# Patient Record
Sex: Female | Born: 1950 | Race: White | Hispanic: No | Smoking: Never smoker
Health system: Southern US, Community
[De-identification: ages and names within clinical notes are randomized; demographics above are authoritative.]

## PROBLEM LIST (undated history)

## (undated) DIAGNOSIS — I10 Essential (primary) hypertension: Secondary | ICD-10-CM

## (undated) DIAGNOSIS — K859 Acute pancreatitis without necrosis or infection, unspecified: Secondary | ICD-10-CM

## (undated) DIAGNOSIS — F329 Major depressive disorder, single episode, unspecified: Secondary | ICD-10-CM

## (undated) DIAGNOSIS — M1990 Unspecified osteoarthritis, unspecified site: Secondary | ICD-10-CM

## (undated) DIAGNOSIS — F32A Depression, unspecified: Secondary | ICD-10-CM

## (undated) DIAGNOSIS — E039 Hypothyroidism, unspecified: Secondary | ICD-10-CM

## (undated) DIAGNOSIS — F419 Anxiety disorder, unspecified: Secondary | ICD-10-CM

## (undated) DIAGNOSIS — Z87440 Personal history of urinary (tract) infections: Secondary | ICD-10-CM

## (undated) DIAGNOSIS — R51 Headache: Secondary | ICD-10-CM

## (undated) DIAGNOSIS — K219 Gastro-esophageal reflux disease without esophagitis: Secondary | ICD-10-CM

## (undated) HISTORY — PX: TUBAL LIGATION: SHX77

## (undated) HISTORY — PX: DILATION AND CURETTAGE OF UTERUS: SHX78

## (undated) HISTORY — DX: Acute pancreatitis without necrosis or infection, unspecified: K85.90

## (undated) HISTORY — PX: VAGINAL HYSTERECTOMY: SUR661

## (undated) HISTORY — PX: EYE SURGERY: SHX253

## (undated) HISTORY — PX: TONSILLECTOMY: SUR1361

## (undated) HISTORY — PX: BREAST SURGERY: SHX581

## (undated) HISTORY — PX: OTHER SURGICAL HISTORY: SHX169

---

## 2009-04-18 HISTORY — PX: COLONOSCOPY: SHX174

## 2010-11-02 ENCOUNTER — Inpatient Hospital Stay: Payer: Self-pay | Admitting: Psychiatry

## 2013-02-05 ENCOUNTER — Other Ambulatory Visit: Payer: Self-pay | Admitting: Orthopedic Surgery

## 2013-02-08 ENCOUNTER — Encounter (HOSPITAL_COMMUNITY): Payer: Self-pay | Admitting: Pharmacy Technician

## 2013-02-12 ENCOUNTER — Encounter (HOSPITAL_COMMUNITY): Payer: Self-pay

## 2013-02-12 ENCOUNTER — Ambulatory Visit (HOSPITAL_COMMUNITY)
Admission: RE | Admit: 2013-02-12 | Discharge: 2013-02-12 | Disposition: A | Discharge status: Home / Self Care | Payer: PRIVATE HEALTH INSURANCE | Source: Ambulatory Visit | Attending: Orthopedic Surgery | Admitting: Orthopedic Surgery

## 2013-02-12 ENCOUNTER — Encounter (HOSPITAL_COMMUNITY)
Admission: RE | Admit: 2013-02-12 | Discharge: 2013-02-12 | Disposition: A | Discharge status: Home / Self Care | Payer: PRIVATE HEALTH INSURANCE | Source: Ambulatory Visit | Attending: Orthopedic Surgery | Admitting: Orthopedic Surgery

## 2013-02-12 ENCOUNTER — Other Ambulatory Visit (HOSPITAL_COMMUNITY): Payer: Self-pay | Admitting: *Deleted

## 2013-02-12 DIAGNOSIS — Z01812 Encounter for preprocedural laboratory examination: Secondary | ICD-10-CM | POA: Insufficient documentation

## 2013-02-12 DIAGNOSIS — Z01818 Encounter for other preprocedural examination: Secondary | ICD-10-CM | POA: Insufficient documentation

## 2013-02-12 DIAGNOSIS — M4120 Other idiopathic scoliosis, site unspecified: Secondary | ICD-10-CM | POA: Insufficient documentation

## 2013-02-12 HISTORY — DX: Essential (primary) hypertension: I10

## 2013-02-12 HISTORY — DX: Major depressive disorder, single episode, unspecified: F32.9

## 2013-02-12 HISTORY — DX: Anxiety disorder, unspecified: F41.9

## 2013-02-12 HISTORY — DX: Gastro-esophageal reflux disease without esophagitis: K21.9

## 2013-02-12 HISTORY — DX: Headache: R51

## 2013-02-12 HISTORY — DX: Unspecified osteoarthritis, unspecified site: M19.90

## 2013-02-12 HISTORY — DX: Hypothyroidism, unspecified: E03.9

## 2013-02-12 HISTORY — DX: Depression, unspecified: F32.A

## 2013-02-12 HISTORY — DX: Personal history of urinary (tract) infections: Z87.440

## 2013-02-12 LAB — CBC WITH DIFFERENTIAL/PLATELET
Eosinophils Absolute: 0.4 10*3/uL (ref 0.0–0.7)
Eosinophils Relative: 5 % (ref 0–5)
Hemoglobin: 13.5 g/dL (ref 12.0–15.0)
Lymphocytes Relative: 41 % (ref 12–46)
Lymphs Abs: 3.2 10*3/uL (ref 0.7–4.0)
MCH: 31.5 pg (ref 26.0–34.0)
MCV: 91.8 fL (ref 78.0–100.0)
Monocytes Relative: 11 % (ref 3–12)
Neutro Abs: 3.3 10*3/uL (ref 1.7–7.7)
Neutrophils Relative %: 43 % (ref 43–77)
RBC: 4.29 MIL/uL (ref 3.87–5.11)

## 2013-02-12 LAB — COMPREHENSIVE METABOLIC PANEL
Alkaline Phosphatase: 67 U/L (ref 39–117)
BUN: 13 mg/dL (ref 6–23)
CO2: 28 mEq/L (ref 19–32)
Chloride: 99 mEq/L (ref 96–112)
Creatinine, Ser: 0.74 mg/dL (ref 0.50–1.10)
GFR calc Af Amer: >90 mL/min (ref >90)
GFR calc non Af Amer: 89 mL/min — ABNORMAL LOW (ref >90)
Glucose, Bld: 87 mg/dL (ref 70–99)
Potassium: 3.8 mEq/L (ref 3.5–5.1)
Total Bilirubin: 0.5 mg/dL (ref 0.3–1.2)
Total Protein: 8.1 g/dL (ref 6.0–8.3)

## 2013-02-12 LAB — TYPE AND SCREEN

## 2013-02-12 LAB — URINE MICROSCOPIC-ADD ON

## 2013-02-12 LAB — URINALYSIS, ROUTINE W REFLEX MICROSCOPIC
Bilirubin Urine: NEGATIVE
Glucose, UA: NEGATIVE mg/dL
Hgb urine dipstick: NEGATIVE
Protein, ur: NEGATIVE mg/dL
Urobilinogen, UA: 0.2 mg/dL (ref 0.0–1.0)

## 2013-02-12 LAB — ABO/RH: ABO/RH(D): A POS

## 2013-02-12 LAB — PROTIME-INR: Prothrombin Time: 12.8 seconds (ref 11.6–15.2)

## 2013-02-12 LAB — SURGICAL PCR SCREEN
MRSA, PCR: NEGATIVE
Staphylococcus aureus: NEGATIVE

## 2013-02-12 LAB — APTT: aPTT: 34 seconds (ref 24–37)

## 2013-02-12 NOTE — Pre-Procedure Instructions (Signed)
Ann Freeman  02/12/2013   Your procedure is scheduled on:  Monday, February 18, 2013 at 8:45 AM.   Report to [facility] Entrance "A" at 6:45 AM.   Call this number if you have problems the morning of surgery: [phone]   Remember:   Do not eat food or drink liquids after midnight Sunday, 02/17/13.   Take these medicines the morning of surgery with A SIP OF WATER: levothyroxine (SYNTHROID, LEVOTHROID), sertraline (ZOLOFT) triamcinolne (NASACORT AQ)  Stop all Vitamins, Herbal Medications, Fish Oil, Aspirin and NSAIDS (Naproxen) as of today, 02/12/13.        Do not wear jewelry, make-up or nail polish.  Do not wear lotions, powders, or perfumes. You may wear deodorant.  Do not shave 48 hours prior to surgery.  Do not bring valuables to the hospital.  [facility] is not responsible                  for any belongings or valuables.               Contacts, dentures or bridgework may not be worn into surgery.  Leave suitcase in the car. After surgery it may be brought to your room.  For patients admitted to the hospital, discharge time is determined by your                treatment team.                Special Instructions: Shower using CHG 2 nights before surgery and the night before surgery.  If you shower the day of surgery use CHG.  Use special wash - you have one bottle of CHG for all showers.  You should use approximately 1/3 of the bottle for each shower.   Please read over the following fact sheets that you were given: Pain Booklet, Coughing and Deep Breathing, Blood Transfusion Information, MRSA Information and Surgical Site Infection Prevention

## 2013-02-13 NOTE — Progress Notes (Signed)
[facility] Internal [facility] in [place] [phone]) and re-requested EKG.  Office to fax to [phone]  (

## 2013-02-17 MED ORDER — TRANEXAMIC ACID 100 MG/ML IV SOLN
1000.0000 mg | INTRAVENOUS | Status: AC
  Administered 2013-02-18: 1000 mg via INTRAVENOUS
  Filled 2013-02-17: qty 10

## 2013-02-17 MED ORDER — CEFAZOLIN SODIUM-DEXTROSE 2-3 GM-% IV SOLR
2.0000 g | INTRAVENOUS | Status: AC
  Administered 2013-02-18: 2 g via INTRAVENOUS
  Filled 2013-02-17: qty 50

## 2013-02-18 ENCOUNTER — Encounter (HOSPITAL_COMMUNITY): Payer: Self-pay | Admitting: Anesthesiology

## 2013-02-18 ENCOUNTER — Inpatient Hospital Stay (HOSPITAL_COMMUNITY)
Admission: RE | Admit: 2013-02-18 | Discharge: 2013-02-20 | DRG: 470 | Disposition: A | Discharge status: Home Health | Payer: PRIVATE HEALTH INSURANCE | Source: Ambulatory Visit | Attending: Orthopedic Surgery | Admitting: Orthopedic Surgery

## 2013-02-18 ENCOUNTER — Inpatient Hospital Stay (HOSPITAL_COMMUNITY): Payer: PRIVATE HEALTH INSURANCE | Admitting: Anesthesiology

## 2013-02-18 ENCOUNTER — Encounter (HOSPITAL_COMMUNITY): Payer: PRIVATE HEALTH INSURANCE | Admitting: Anesthesiology

## 2013-02-18 ENCOUNTER — Encounter (HOSPITAL_COMMUNITY)
Admission: RE | Disposition: A | Discharge status: Home Health | Payer: Self-pay | Source: Ambulatory Visit | Attending: Orthopedic Surgery

## 2013-02-18 DIAGNOSIS — F329 Major depressive disorder, single episode, unspecified: Secondary | ICD-10-CM | POA: Diagnosis present

## 2013-02-18 DIAGNOSIS — K219 Gastro-esophageal reflux disease without esophagitis: Secondary | ICD-10-CM | POA: Diagnosis present

## 2013-02-18 DIAGNOSIS — Z9849 Cataract extraction status, unspecified eye: Secondary | ICD-10-CM

## 2013-02-18 DIAGNOSIS — F411 Generalized anxiety disorder: Secondary | ICD-10-CM | POA: Diagnosis present

## 2013-02-18 DIAGNOSIS — E039 Hypothyroidism, unspecified: Secondary | ICD-10-CM | POA: Diagnosis present

## 2013-02-18 DIAGNOSIS — M1710 Unilateral primary osteoarthritis, unspecified knee: Principal | ICD-10-CM | POA: Diagnosis present

## 2013-02-18 DIAGNOSIS — Z96651 Presence of right artificial knee joint: Secondary | ICD-10-CM

## 2013-02-18 DIAGNOSIS — Z882 Allergy status to sulfonamides status: Secondary | ICD-10-CM

## 2013-02-18 DIAGNOSIS — D62 Acute posthemorrhagic anemia: Secondary | ICD-10-CM | POA: Diagnosis not present

## 2013-02-18 DIAGNOSIS — Z9851 Tubal ligation status: Secondary | ICD-10-CM

## 2013-02-18 DIAGNOSIS — Z8249 Family history of ischemic heart disease and other diseases of the circulatory system: Secondary | ICD-10-CM

## 2013-02-18 DIAGNOSIS — I10 Essential (primary) hypertension: Secondary | ICD-10-CM | POA: Diagnosis present

## 2013-02-18 DIAGNOSIS — Z23 Encounter for immunization: Secondary | ICD-10-CM

## 2013-02-18 DIAGNOSIS — F3289 Other specified depressive episodes: Secondary | ICD-10-CM | POA: Diagnosis present

## 2013-02-18 DIAGNOSIS — Z7901 Long term (current) use of anticoagulants: Secondary | ICD-10-CM

## 2013-02-18 DIAGNOSIS — Z961 Presence of intraocular lens: Secondary | ICD-10-CM

## 2013-02-18 DIAGNOSIS — Z79899 Other long term (current) drug therapy: Secondary | ICD-10-CM

## 2013-02-18 HISTORY — PX: TOTAL KNEE ARTHROPLASTY: SHX125

## 2013-02-18 LAB — CBC
HCT: 36.6 % (ref 36.0–46.0)
MCHC: 34.7 g/dL (ref 30.0–36.0)
MCV: 92.4 fL (ref 78.0–100.0)
Platelets: 208 10*3/uL (ref 150–400)
RDW: 13.5 % (ref 11.5–15.5)
WBC: 10.6 10*3/uL — ABNORMAL HIGH (ref 4.0–10.5)

## 2013-02-18 LAB — CREATININE, SERUM: GFR calc Af Amer: >90 mL/min (ref >90)

## 2013-02-18 SURGERY — ARTHROPLASTY, KNEE, TOTAL
Anesthesia: General | Site: Knee | Laterality: Right | Wound class: Clean

## 2013-02-18 MED ORDER — SERTRALINE HCL 100 MG PO TABS
100.0000 mg | ORAL_TABLET | Freq: Every day | ORAL | Status: DC
Start: 2013-02-19 — End: 2013-02-20
  Administered 2013-02-19 – 2013-02-20 (×2): 100 mg via ORAL
  Filled 2013-02-18 (×2): qty 1

## 2013-02-18 MED ORDER — BISOPROLOL-HYDROCHLOROTHIAZIDE 5-6.25 MG PO TABS
1.0000 | ORAL_TABLET | Freq: Every day | ORAL | Status: DC
  Administered 2013-02-19 – 2013-02-20 (×2): 1 via ORAL
  Filled 2013-02-18 (×2): qty 1

## 2013-02-18 MED ORDER — METOCLOPRAMIDE HCL 10 MG PO TABS: 5.0000 mg | ORAL_TABLET | Freq: Three times a day (TID) | ORAL | Status: DC | PRN

## 2013-02-18 MED ORDER — CHLORHEXIDINE GLUCONATE 4 % EX LIQD: 60.0000 mL | Freq: Once | CUTANEOUS | Status: DC

## 2013-02-18 MED ORDER — SENNOSIDES-DOCUSATE SODIUM 8.6-50 MG PO TABS: 1.0000 | ORAL_TABLET | Freq: Every evening | ORAL | Status: DC | PRN

## 2013-02-18 MED ORDER — NAPROXEN 500 MG PO TABS
500.0000 mg | ORAL_TABLET | Freq: Two times a day (BID) | ORAL | Status: DC | PRN
  Filled 2013-02-18: qty 1

## 2013-02-18 MED ORDER — ALUM & MAG HYDROXIDE-SIMETH 200-200-20 MG/5ML PO SUSP: 30.0000 mL | ORAL | Status: DC | PRN

## 2013-02-18 MED ORDER — LACTATED RINGERS IV SOLN
INTRAVENOUS | Status: DC
  Administered 2013-02-18 (×2): via INTRAVENOUS

## 2013-02-18 MED ORDER — METHOCARBAMOL 100 MG/ML IJ SOLN
500.0000 mg | Freq: Four times a day (QID) | INTRAVENOUS | Status: DC | PRN
  Filled 2013-02-18: qty 5

## 2013-02-18 MED ORDER — FENTANYL CITRATE 0.05 MG/ML IJ SOLN
INTRAMUSCULAR | Status: AC
  Administered 2013-02-18: 100 ug via INTRAVENOUS
  Filled 2013-02-18: qty 2

## 2013-02-18 MED ORDER — ACETAMINOPHEN 650 MG RE SUPP: 650.0000 mg | Freq: Four times a day (QID) | RECTAL | Status: DC | PRN

## 2013-02-18 MED ORDER — DOCUSATE SODIUM 100 MG PO CAPS
100.0000 mg | ORAL_CAPSULE | Freq: Two times a day (BID) | ORAL | Status: DC
  Administered 2013-02-18 – 2013-02-20 (×5): 100 mg via ORAL
  Filled 2013-02-18 (×6): qty 1

## 2013-02-18 MED ORDER — SODIUM CHLORIDE 0.9 % IV SOLN: INTRAVENOUS | Status: DC

## 2013-02-18 MED ORDER — FENTANYL CITRATE 0.05 MG/ML IJ SOLN
100.0000 ug | Freq: Once | INTRAMUSCULAR | Status: AC
  Administered 2013-02-18: 100 ug via INTRAVENOUS

## 2013-02-18 MED ORDER — DEXTROSE 5 % IV SOLN
INTRAVENOUS | Status: DC | PRN
  Administered 2013-02-18 (×2): via INTRAVENOUS

## 2013-02-18 MED ORDER — PHENOL 1.4 % MT LIQD: 1.0000 | OROMUCOSAL | Status: DC | PRN

## 2013-02-18 MED ORDER — METOCLOPRAMIDE HCL 5 MG/ML IJ SOLN: 5.0000 mg | Freq: Three times a day (TID) | INTRAMUSCULAR | Status: DC | PRN

## 2013-02-18 MED ORDER — HYDROMORPHONE HCL PF 1 MG/ML IJ SOLN
INTRAMUSCULAR | Status: AC
  Filled 2013-02-18: qty 1

## 2013-02-18 MED ORDER — OXYCODONE HCL 5 MG PO TABS
ORAL_TABLET | ORAL | Status: AC
  Filled 2013-02-18: qty 1

## 2013-02-18 MED ORDER — DIPHENHYDRAMINE HCL 12.5 MG/5ML PO ELIX: 12.5000 mg | ORAL_SOLUTION | ORAL | Status: DC | PRN

## 2013-02-18 MED ORDER — ONDANSETRON HCL 4 MG PO TABS: 4.0000 mg | ORAL_TABLET | Freq: Four times a day (QID) | ORAL | Status: DC | PRN

## 2013-02-18 MED ORDER — OXYCODONE HCL ER 10 MG PO T12A
10.0000 mg | EXTENDED_RELEASE_TABLET | Freq: Two times a day (BID) | ORAL | Status: DC
  Administered 2013-02-18 – 2013-02-20 (×5): 10 mg via ORAL
  Filled 2013-02-18 (×6): qty 1

## 2013-02-18 MED ORDER — SIMVASTATIN 40 MG PO TABS
40.0000 mg | ORAL_TABLET | Freq: Every evening | ORAL | Status: DC
  Administered 2013-02-18 – 2013-02-19 (×2): 40 mg via ORAL
  Filled 2013-02-18 (×3): qty 1

## 2013-02-18 MED ORDER — BISACODYL 5 MG PO TBEC: 5.0000 mg | DELAYED_RELEASE_TABLET | Freq: Every day | ORAL | Status: DC | PRN

## 2013-02-18 MED ORDER — PHENYLEPHRINE HCL 10 MG/ML IJ SOLN
INTRAMUSCULAR | Status: DC | PRN
  Administered 2013-02-18: 40 ug via INTRAVENOUS
  Administered 2013-02-18: 80 ug via INTRAVENOUS
  Administered 2013-02-18: 120 ug via INTRAVENOUS

## 2013-02-18 MED ORDER — SODIUM CHLORIDE 0.9 % IR SOLN
Status: DC | PRN
  Administered 2013-02-18: 3000 mL

## 2013-02-18 MED ORDER — TRIAMCINOLONE ACETONIDE 55 MCG/ACT NA AERO: 2.0000 | INHALATION_SPRAY | Freq: Every day | NASAL | Status: DC | PRN

## 2013-02-18 MED ORDER — SODIUM CHLORIDE 0.9 % IV SOLN
INTRAVENOUS | Status: DC
  Administered 2013-02-18 – 2013-02-19 (×2): via INTRAVENOUS

## 2013-02-18 MED ORDER — OXYCODONE HCL 5 MG/5ML PO SOLN: 5.0000 mg | Freq: Once | ORAL | Status: AC | PRN

## 2013-02-18 MED ORDER — MIDAZOLAM HCL 2 MG/2ML IJ SOLN
INTRAMUSCULAR | Status: AC
  Administered 2013-02-18: 2 mg
  Filled 2013-02-18: qty 2

## 2013-02-18 MED ORDER — ACETAMINOPHEN 325 MG PO TABS: 650.0000 mg | ORAL_TABLET | Freq: Four times a day (QID) | ORAL | Status: DC | PRN

## 2013-02-18 MED ORDER — BUPIVACAINE LIPOSOME 1.3 % IJ SUSP
20.0000 mL | Freq: Once | INTRAMUSCULAR | Status: DC
  Filled 2013-02-18: qty 20

## 2013-02-18 MED ORDER — CEFAZOLIN SODIUM 1-5 GM-% IV SOLN
1.0000 g | Freq: Four times a day (QID) | INTRAVENOUS | Status: AC
  Administered 2013-02-18 (×2): 1 g via INTRAVENOUS
  Filled 2013-02-18 (×3): qty 50

## 2013-02-18 MED ORDER — METHOCARBAMOL 500 MG PO TABS
ORAL_TABLET | ORAL | Status: AC
  Administered 2013-02-18: 500 mg
  Filled 2013-02-18: qty 1

## 2013-02-18 MED ORDER — BUPIVACAINE HCL (PF) 0.5 % IJ SOLN
INTRAMUSCULAR | Status: DC | PRN
  Administered 2013-02-18: 30 mL

## 2013-02-18 MED ORDER — LEVOTHYROXINE SODIUM 112 MCG PO TABS
112.0000 ug | ORAL_TABLET | Freq: Every day | ORAL | Status: DC
  Administered 2013-02-19 – 2013-02-20 (×2): 112 ug via ORAL
  Filled 2013-02-18 (×3): qty 1

## 2013-02-18 MED ORDER — OXYCODONE HCL 5 MG PO TABS
5.0000 mg | ORAL_TABLET | ORAL | Status: DC | PRN
  Administered 2013-02-18 – 2013-02-20 (×11): 10 mg via ORAL
  Filled 2013-02-18 (×11): qty 2

## 2013-02-18 MED ORDER — METHOCARBAMOL 500 MG PO TABS
500.0000 mg | ORAL_TABLET | Freq: Four times a day (QID) | ORAL | Status: DC | PRN
  Administered 2013-02-18 – 2013-02-20 (×5): 500 mg via ORAL
  Filled 2013-02-18 (×5): qty 1

## 2013-02-18 MED ORDER — INFLUENZA VAC SPLIT QUAD 0.5 ML IM SUSP
0.5000 mL | INTRAMUSCULAR | Status: AC
  Administered 2013-02-19: 0.5 mL via INTRAMUSCULAR
  Filled 2013-02-18: qty 0.5

## 2013-02-18 MED ORDER — ONDANSETRON HCL 4 MG/2ML IJ SOLN
INTRAMUSCULAR | Status: DC | PRN
  Administered 2013-02-18: 4 mg via INTRAVENOUS

## 2013-02-18 MED ORDER — 0.9 % SODIUM CHLORIDE (POUR BTL) OPTIME
TOPICAL | Status: DC | PRN
  Administered 2013-02-18: 1000 mL

## 2013-02-18 MED ORDER — BUPIVACAINE-EPINEPHRINE PF 0.5-1:200000 % IJ SOLN
INTRAMUSCULAR | Status: DC | PRN
  Administered 2013-02-18: 25 mL

## 2013-02-18 MED ORDER — ONDANSETRON HCL 4 MG/2ML IJ SOLN: 4.0000 mg | Freq: Four times a day (QID) | INTRAMUSCULAR | Status: DC | PRN

## 2013-02-18 MED ORDER — MENTHOL 3 MG MT LOZG: 1.0000 | LOZENGE | OROMUCOSAL | Status: DC | PRN

## 2013-02-18 MED ORDER — SODIUM CHLORIDE 0.9 % IJ SOLN
INTRAMUSCULAR | Status: DC | PRN
  Administered 2013-02-18: 10:00:00

## 2013-02-18 MED ORDER — LIDOCAINE HCL (CARDIAC) 20 MG/ML IV SOLN
INTRAVENOUS | Status: DC | PRN
  Administered 2013-02-18: 80 mg via INTRAVENOUS

## 2013-02-18 MED ORDER — OXYCODONE HCL 5 MG PO TABS
5.0000 mg | ORAL_TABLET | Freq: Once | ORAL | Status: AC | PRN
  Administered 2013-02-18: 5 mg via ORAL

## 2013-02-18 MED ORDER — HYDROMORPHONE HCL PF 1 MG/ML IJ SOLN
1.0000 mg | INTRAMUSCULAR | Status: DC | PRN
  Administered 2013-02-19 (×2): 1 mg via INTRAVENOUS
  Filled 2013-02-18 (×2): qty 1

## 2013-02-18 MED ORDER — FENTANYL CITRATE 0.05 MG/ML IJ SOLN
INTRAMUSCULAR | Status: DC | PRN
  Administered 2013-02-18 (×2): 50 ug via INTRAVENOUS
  Administered 2013-02-18: 100 ug via INTRAVENOUS
  Administered 2013-02-18: 50 ug via INTRAVENOUS

## 2013-02-18 MED ORDER — PROPOFOL 10 MG/ML IV BOLUS
INTRAVENOUS | Status: DC | PRN
  Administered 2013-02-18: 200 mg via INTRAVENOUS
  Administered 2013-02-18: 50 mg via INTRAVENOUS

## 2013-02-18 MED ORDER — HYDROMORPHONE HCL PF 1 MG/ML IJ SOLN
0.2500 mg | INTRAMUSCULAR | Status: DC | PRN
  Administered 2013-02-18: 0.5 mg via INTRAVENOUS
  Administered 2013-02-18: 11:00:00 via INTRAVENOUS

## 2013-02-18 MED ORDER — HYDROMORPHONE HCL PF 1 MG/ML IJ SOLN
INTRAMUSCULAR | Status: DC | PRN
  Administered 2013-02-18 (×2): 0.5 mg via INTRAVENOUS

## 2013-02-18 MED ORDER — DEXAMETHASONE SODIUM PHOSPHATE 10 MG/ML IJ SOLN
INTRAMUSCULAR | Status: DC | PRN
  Administered 2013-02-18: 8 mg via INTRAVENOUS

## 2013-02-18 MED ORDER — ZOLPIDEM TARTRATE 5 MG PO TABS: 5.0000 mg | ORAL_TABLET | Freq: Every evening | ORAL | Status: DC | PRN

## 2013-02-18 MED ORDER — ENOXAPARIN SODIUM 30 MG/0.3ML ~~LOC~~ SOLN
30.0000 mg | Freq: Two times a day (BID) | SUBCUTANEOUS | Status: DC
  Administered 2013-02-19 – 2013-02-20 (×3): 30 mg via SUBCUTANEOUS
  Filled 2013-02-18 (×5): qty 0.3

## 2013-02-18 MED ORDER — CEFAZOLIN SODIUM-DEXTROSE 2-3 GM-% IV SOLR
INTRAVENOUS | Status: AC
  Filled 2013-02-18: qty 50

## 2013-02-18 MED ORDER — ONDANSETRON HCL 4 MG/2ML IJ SOLN: 4.0000 mg | Freq: Once | INTRAMUSCULAR | Status: DC | PRN

## 2013-02-18 MED ORDER — FLEET ENEMA 7-19 GM/118ML RE ENEM: 1.0000 | ENEMA | Freq: Once | RECTAL | Status: AC | PRN

## 2013-02-18 MED ORDER — SODIUM CHLORIDE 0.9 % IJ SOLN
INTRAMUSCULAR | Status: AC
  Filled 2013-02-18: qty 3

## 2013-02-18 MED ORDER — MIDAZOLAM HCL 5 MG/ML IJ SOLN: 2.0000 mg | Freq: Once | INTRAMUSCULAR | Status: DC

## 2013-02-18 SURGICAL SUPPLY — 60 items
BANDAGE ESMARK 6X9 LF (GAUZE/BANDAGES/DRESSINGS) ×1 IMPLANT
BLADE SAGITTAL 13X1.27X60 (BLADE) ×2 IMPLANT
BLADE SAW SGTL 83.5X18.5 (BLADE) ×2 IMPLANT
BLADE SURG 10 STRL SS (BLADE) ×2 IMPLANT
BNDG ESMARK 6X9 LF (GAUZE/BANDAGES/DRESSINGS) ×2
BOWL SMART MIX CTS (DISPOSABLE) ×2 IMPLANT
CAP POR TM CP VIT E LN CER HD ×2 IMPLANT
CEMENT BONE SIMPLEX SPEEDSET (Cement) ×4 IMPLANT
CLOTH BEACON ORANGE TIMEOUT ST (SAFETY) ×2 IMPLANT
COVER SURGICAL LIGHT HANDLE (MISCELLANEOUS) ×2 IMPLANT
CUFF TOURNIQUET SINGLE 34IN LL (TOURNIQUET CUFF) ×2 IMPLANT
DRAPE EXTREMITY T 121X128X90 (DRAPE) ×2 IMPLANT
DRAPE INCISE IOBAN 66X45 STRL (DRAPES) ×4 IMPLANT
DRAPE PROXIMA HALF (DRAPES) ×2 IMPLANT
DRAPE U-SHAPE 47X51 STRL (DRAPES) ×2 IMPLANT
DRSG ADAPTIC 3X8 NADH LF (GAUZE/BANDAGES/DRESSINGS) ×2 IMPLANT
DRSG PAD ABDOMINAL 8X10 ST (GAUZE/BANDAGES/DRESSINGS) ×2 IMPLANT
DURAPREP 26ML APPLICATOR (WOUND CARE) ×4 IMPLANT
ELECT REM PT RETURN 9FT ADLT (ELECTROSURGICAL) ×2
ELECTRODE REM PT RTRN 9FT ADLT (ELECTROSURGICAL) ×1 IMPLANT
EVACUATOR 1/8 PVC DRAIN (DRAIN) ×2 IMPLANT
GLOVE BIO SURGEON STRL SZ8.5 (GLOVE) ×2 IMPLANT
GLOVE BIOGEL M 7.0 STRL (GLOVE) ×2 IMPLANT
GLOVE BIOGEL PI IND STRL 7.5 (GLOVE) ×1 IMPLANT
GLOVE BIOGEL PI IND STRL 8.5 (GLOVE) ×1 IMPLANT
GLOVE BIOGEL PI INDICATOR 7.5 (GLOVE) ×1
GLOVE BIOGEL PI INDICATOR 8.5 (GLOVE) ×1
GLOVE SS BIOGEL STRL SZ 7 (GLOVE) ×1 IMPLANT
GLOVE SUPERSENSE BIOGEL SZ 7 (GLOVE) ×1
GLOVE SURG ORTHO 8.0 STRL STRW (GLOVE) ×4 IMPLANT
GOWN PREVENTION PLUS XLARGE (GOWN DISPOSABLE) ×4 IMPLANT
GOWN STRL NON-REIN LRG LVL3 (GOWN DISPOSABLE) IMPLANT
GOWN STRL REIN XL XLG (GOWN DISPOSABLE) ×2 IMPLANT
HANDPIECE INTERPULSE COAX TIP (DISPOSABLE) ×1
HOOD PEEL AWAY FACE SHEILD DIS (HOOD) ×6 IMPLANT
KIT BASIN OR (CUSTOM PROCEDURE TRAY) ×2 IMPLANT
KIT ROOM TURNOVER OR (KITS) ×2 IMPLANT
MANIFOLD NEPTUNE II (INSTRUMENTS) ×2 IMPLANT
NEEDLE 22X1 1/2 (OR ONLY) (NEEDLE) ×2 IMPLANT
NEEDLE HYPO 21X1.5 SAFETY (NEEDLE) ×2 IMPLANT
NS IRRIG 1000ML POUR BTL (IV SOLUTION) ×2 IMPLANT
PACK TOTAL JOINT (CUSTOM PROCEDURE TRAY) ×2 IMPLANT
PAD ARMBOARD 7.5X6 YLW CONV (MISCELLANEOUS) ×2 IMPLANT
PADDING CAST COTTON 6X4 STRL (CAST SUPPLIES) ×2 IMPLANT
SET HNDPC FAN SPRY TIP SCT (DISPOSABLE) ×1 IMPLANT
SPONGE GAUZE 4X4 12PLY (GAUZE/BANDAGES/DRESSINGS) ×2 IMPLANT
STAPLER VISISTAT 35W (STAPLE) ×2 IMPLANT
SUCTION FRAZIER TIP 10 FR DISP (SUCTIONS) ×2 IMPLANT
SUT BONE WAX W31G (SUTURE) ×2 IMPLANT
SUT VIC AB 0 CTB1 27 (SUTURE) ×4 IMPLANT
SUT VIC AB 1 CT1 27 (SUTURE) ×2
SUT VIC AB 1 CT1 27XBRD ANBCTR (SUTURE) ×2 IMPLANT
SUT VIC AB 2-0 CT1 27 (SUTURE) ×2
SUT VIC AB 2-0 CT1 TAPERPNT 27 (SUTURE) ×2 IMPLANT
SYR 50ML LL SCALE MARK (SYRINGE) ×2 IMPLANT
SYR CONTROL 10ML LL (SYRINGE) ×2 IMPLANT
TOWEL OR 17X24 6PK STRL BLUE (TOWEL DISPOSABLE) ×2 IMPLANT
TOWEL OR 17X26 10 PK STRL BLUE (TOWEL DISPOSABLE) ×2 IMPLANT
TRAY FOLEY CATH 16FRSI W/METER (SET/KITS/TRAYS/PACK) ×2 IMPLANT
WATER STERILE IRR 1000ML POUR (IV SOLUTION) ×2 IMPLANT

## 2013-02-18 NOTE — Progress Notes (Signed)
UR COMPLETED  

## 2013-02-18 NOTE — Progress Notes (Signed)
Orthopedic Tech Progress Note Patient Details:  Ann Freeman Aug 29, 1950 [id number] CPM applied to Right LE with appropriate settings. OHF applied to bed. Patient provided with Footsie roll.  CPM Right Knee CPM Right Knee: On Right Knee Flexion (Degrees): 60 Right Knee Extension (Degrees): 0   Asia R Thompson 02/18/2013, 11:39 AM

## 2013-02-18 NOTE — Transfer of Care (Signed)
Immediate Anesthesia Transfer of Care Note  Patient: Ann Freeman  Procedure(s) Performed: Procedure(s): TOTAL KNEE ARTHROPLASTY- RIGHT (Right)  Patient Location: PACU  Anesthesia Type:General  Level of Consciousness: awake, alert , oriented, sedated and patient cooperative  Airway & Oxygen Therapy: Patient Spontanous Breathing and Patient connected to nasal cannula oxygen  Post-op Assessment: Report given to PACU RN and Post -op Vital signs reviewed and stable  Post vital signs: Reviewed and stable  Complications: No apparent anesthesia complications

## 2013-02-18 NOTE — Preoperative (Signed)
Beta Blockers   Reason not to administer Beta Blockers:Not Applicable 

## 2013-02-18 NOTE — Op Note (Signed)
TOTAL KNEE REPLACEMENT OPERATIVE NOTE:  02/18/2013  1:27 PM  PATIENT:  Ann Freeman  62 y.o. female  PRE-OPERATIVE DIAGNOSIS:  osteoarthritis right knee  POST-OPERATIVE DIAGNOSIS:  osteoarthritis right knee  PROCEDURE:  Procedure(s): TOTAL KNEE ARTHROPLASTY- RIGHT  SURGEON:  Surgeon(s): Dannielle Huh, MD  PHYSICIAN ASSISTANT: Altamese Cabal, [facility]  ANESTHESIA:   general  DRAINS: Hemovac  SPECIMEN: None  COUNTS:  Correct  TOURNIQUET:   Total Tourniquet Time Documented: Thigh (Left) - 49 minutes Total: Thigh (Left) - 49 minutes   DICTATION:  Indication for procedure:    The patient is a 62 y.o. female who has failed conservative treatment for osteoarthritis right knee.  Informed consent was obtained prior to anesthesia. The risks versus benefits of the operation were explain and in a way the patient can, and did, understand.   On the implant demand matching protocol, this patient scored 10.  Therefore, this patient did not receive a polyethylene insert with vitamin E which is a high demand implant.  Description of procedure:     The patient was taken to the operating room and placed under anesthesia.  The patient was positioned in the usual fashion taking care that all body parts were adequately padded and/or protected.  I foley catheter was placed.  A tourniquet was applied and the leg prepped and draped in the usual sterile fashion.  The extremity was exsanguinated with the esmarch and tourniquet inflated to 350 mmHg.  Pre-operative range of motion was normal.  The knee was in 3 degree of mild valgus.  A midline incision approximately 6-7 inches long was made with a #10 blade.  A new blade was used to make a parapatellar arthrotomy going 2-3 cm into the quadriceps tendon, over the patella, and alongside the medial aspect of the patellar tendon.  A synovectomy was then performed with the #10 blade and forceps. I then elevated the deep MCL off the medial tibial metaphysis  subperiosteally around to the semimembranosus attachment.    I everted the patella and used calipers to measure patellar thickness.  I used the reamer to ream down to appropriate thickness to recreate the native thickness.  I then removed excess bone with the rongeur and sagittal saw.  I used the appropriately sized template and drilled the three lug holes.  I then put the trial in place and measured the thickness with the calipers to ensure recreation of the native thickness.  The trial was then removed and the patella subluxed and the knee brought into flexion.  A homan retractor was place to retract and protect the patella and lateral structures.  A Z-retractor was place medially to protect the medial structures.  The extra-medullary alignment system was used to make cut the tibial articular surface perpendicular to the anamotic axis of the tibia and in 3 degrees of posterior slope.  The cut surface and alignment jig was removed.  I then used the intramedullary alignment guide to make a 4 valgus cut on the distal femur.  I then marked out the epicondylar axis on the distal femur.  The posterior condylar axis measured 3 degrees.  I then used the anterior referencing sizer and measured the femur to be a size 6.  The 4-In-1 cutting block was screwed into place in external rotation matching the posterior condylar angle, making our cuts perpendicular to the epicondylar axis.  Anterior, posterior and chamfer cuts were made with the sagittal saw.  The cutting block and cut pieces were removed.  A lamina  spreader was placed in 90 degrees of flexion.  The ACL, PCL, menisci, and posterior condylar osteophytes were removed.  A 12 mm spacer blocked was found to offer good flexion and extension gap balance after minimal in degree releasing.   The scoop retractor was then placed and the femoral finishing block was pinned in place.  The small sagittal saw was used as well as the lug drill to finish the femur.  The block  and cut surfaces were removed and the medullary canal hole filled with autograft bone from the cut pieces.  The tibia was delivered forward in deep flexion and external rotation.  A size D tray was selected and pinned into place centered on the medial 1/3 of the tibial tubercle.  The reamer and keel was used to prepare the tibia through the tray.    I then trialed with the size 6 femur, size D tibia, a 12 mm insert and the 32 patella.  I had excellent flexion/extension gap balance, excellent patella tracking.  Flexion was full and beyond 120 degrees; extension was zero.  These components were chosen and the staff opened them to me on the back table while the knee was lavaged copiously and the cement mixed.  The soft tissue was infiltrated with 60cc of exparel 1.3% through a 21 gauge needle.  I cemented in the components and removed all excess cement.  The polyethylene tibial component was snapped into place and the knee placed in extension while cement was hardening.  The capsule was infilltrated with 30cc of .25% Marcaine with epinephrine.  A hemovac was place in the joint exiting superolaterally.  A pain pump was place superomedially superficial to the arthrotomy.  Once the cement was hard, the tourniquet was let down.  Hemostasis was obtained.  The arthrotomy was closed with figure-8 #1 vicryl sutures.  The deep soft tissues were closed with #0 vicryls and the subcuticular layer closed with a running #2-0 vicryl.  The skin was reapproximated and closed with skin staples.  The wound was dressed with xeroform, 4 x4's, 2 ABD sponges, a single layer of webril and a TED stocking.   The patient was then awakened, extubated, and taken to the recovery room in stable condition.  BLOOD LOSS:  300cc DRAINS: 1 hemovac, 1 pain catheter COMPLICATIONS:  None.  PLAN OF CARE: Admit to inpatient   PATIENT DISPOSITION:  PACU - hemodynamically stable.   Delay start of Pharmacological VTE agent (>24hrs) due to  surgical blood loss or risk of bleeding:  not applicable  Please fax a copy of this op note to my office at [phone] (please only include page 1 and 2 of the Case Information op note)

## 2013-02-18 NOTE — Progress Notes (Signed)
Orthopedic Tech Progress Note Patient Details:  Ann Freeman [birth year] [id number]  Patient ID: Ann Freeman, female   DOB: 1951-01-25, 62 y.o.   MRN: [id number] Placed pt's rle in cpm @ 0-60 degrees; will increase as pt tolerates; rn notified  Nikki Dom 02/18/2013, 8:03 PM

## 2013-02-18 NOTE — Plan of Care (Signed)
Problem: Consults Goal: Diagnosis- Total Joint Replacement Primary Total Knee Right     

## 2013-02-18 NOTE — Anesthesia Postprocedure Evaluation (Signed)
  Anesthesia Post-op Note  Patient: Ann Freeman  Procedure(s) Performed: Procedure(s): TOTAL KNEE ARTHROPLASTY- RIGHT (Right)  Patient Location: PACU  Anesthesia Type:General and GA combined with regional for post-op pain  Level of Consciousness: awake, alert  and oriented  Airway and Oxygen Therapy: Patient Spontanous Breathing and Patient connected to nasal cannula oxygen  Post-op Pain: mild  Post-op Assessment: Post-op Vital signs reviewed  Post-op Vital Signs: Reviewed  Complications: No apparent anesthesia complications

## 2013-02-18 NOTE — H&P (Signed)
  Ann Freeman MRN:  [id number] DOB/SEX:  [birth year]/female  CHIEF COMPLAINT:  Painful right Knee  HISTORY: Patient is a 62 y.o. female presented with a history of pain in the right knee. Onset of symptoms was gradual starting several years ago with gradually worsening course since that time. Prior procedures on the knee include arthroscopy. Patient has been treated conservatively with over-the-counter NSAIDs and activity modification. Patient currently rates pain in the knee at 9 out of 10 with activity. There is pain at night.  PAST MEDICAL HISTORY: There are no active problems to display for this patient.  Past Medical History  Diagnosis Date  . Hypertension   . Anxiety   . Hypothyroidism   . Depression     has been hospitalized in the past  . H/O bladder infections   . GERD (gastroesophageal reflux disease)     in the past, not treated for it at present  . Headache(784.0)     Migraines  . Arthritis    Past Surgical History  Procedure Laterality Date  . Tonsillectomy    . Vaginal hysterectomy    . Tubal ligation    . Breast surgery Right     biopsy x 2  . Eye surgery      cataract surgery with implants in both eye  . Dilation and curettage of uterus    . Chin implant  6 years ago     MEDICATIONS:   No prescriptions prior to admission    ALLERGIES:   Allergies  Allergen Reactions  . Sulfa Antibiotics Hives and Swelling    REVIEW OF SYSTEMS:  Pertinent items are noted in HPI.   FAMILY HISTORY:   Family History  Problem Relation Age of Onset  . Heart disease Mother   . Colitis Brother     SOCIAL HISTORY:   History  Substance Use Topics  . Smoking status: Never Smoker   . Smokeless tobacco: Never Used  . Alcohol Use: Yes     Comment: approx 3 glasses of wine a week     EXAMINATION:  Vital signs in last 24 hours:    General appearance: alert, cooperative and no distress Lungs: clear to auscultation bilaterally Heart: regular rate and rhythm,  S1, S2 normal, no murmur, click, rub or gallop Abdomen: soft, non-tender; bowel sounds normal; no masses,  no organomegaly Extremities: extremities normal, atraumatic, no cyanosis or edema and Homans sign is negative, no sign of DVT Pulses: 2+ and symmetric Skin: Skin color, texture, turgor normal. No rashes or lesions Neurologic: Alert and oriented X 3, normal strength and tone. Normal symmetric reflexes. Normal coordination and gait  Musculoskeletal:  ROM 0-110, Ligaments intact,  Imaging Review Plain radiographs demonstrate severe degenerative joint disease of the right knee. The overall alignment is mild valgus. The bone quality appears to be good for age and reported activity level.  Assessment/Plan: End stage arthritis, right knee   The patient history, physical examination and imaging studies are consistent with advanced degenerative joint disease of the right knee. The patient has failed conservative treatment.  The clearance notes were reviewed.  After discussion with the patient it was felt that Total Knee Replacement was indicated. The procedure,  risks, and benefits of total knee arthroplasty were presented and reviewed. The risks including but not limited to aseptic loosening, infection, blood clots, vascular injury, stiffness, patella tracking problems complications among others were discussed. The patient acknowledged the explanation, agreed to proceed with the plan.  Blayne Garlick 02/18/2013, 5:42 AM

## 2013-02-18 NOTE — Anesthesia Procedure Notes (Addendum)
Anesthesia Regional Block:  Femoral nerve block  Pre-Anesthetic Checklist: ,, timeout performed, Correct Patient, Correct Site, Correct Laterality, Correct Procedure, Correct Position, site marked, Risks and benefits discussed,  Surgical consent,  Pre-op evaluation,  At surgeon's request and post-op pain management  Laterality: Right and Lower  Prep: chloraprep       Needles:  Injection technique: Single-shot  Needle Type: Echogenic Needle     Needle Length: 9cm  Needle Gauge: 21 and 21 G    Additional Needles:  Procedures: ultrasound guided (picture in chart) Femoral nerve block Narrative:  Start time: 02/18/2013 8:14 AM End time: 02/18/2013 8:21 AM Injection made incrementally with aspirations every 5 mL.  Performed by: Personally  Anesthesiologist: Sheldon Silvan, MD  Femoral nerve block Procedure Name: LMA Insertion Date/Time: 02/18/2013 8:45 AM Performed by: Tyrone Nine Pre-anesthesia Checklist: Patient identified, Timeout performed, Emergency Drugs available, Patient being monitored and Suction available Patient Re-evaluated:Patient Re-evaluated prior to inductionOxygen Delivery Method: Circle system utilized Preoxygenation: Pre-oxygenation with 100% oxygen Intubation Type: IV induction Ventilation: Mask ventilation without difficulty LMA: LMA with gastric port inserted LMA Size: 4.0 Number of attempts: 1 Intubation method: Gauze airway inserted between teeth. Placement Confirmation: positive ETCO2 Tube secured with: Tape Dental Injury: Teeth and Oropharynx as per pre-operative assessment

## 2013-02-18 NOTE — Evaluation (Signed)
Physical Therapy Evaluation Patient Details Name: Ann Freeman MRN: [id number] DOB: 27-Jul-1950 Today's Date: 02/18/2013 Time: 4098-1191 PT Time Calculation (min): 26 min  PT Assessment / Plan / Recommendation History of Present Illness  s/p RTKA  Clinical Impression  Pt is s/p TKA resulting in the deficits listed below (see PT Problem List).  Pt will benefit from skilled PT to increase their independence and safety with mobility to allow discharge to the venue listed below.      PT Assessment  Patient needs continued PT services    Follow Up Recommendations  Home health PT;Supervision/Assistance - 24 hour (if unable to arrange for 24 hour assist at least initially, may need to consider SNF)    Does the patient have the potential to tolerate intense rehabilitation      Barriers to Discharge Decreased caregiver support Pt is hoping to be able to arrange 24 hour assist at home; If unable, we may need to consider SNF to maximize independence and safety prior to dchome    Equipment Recommendations  Rolling walker with 5" wheels;3in1 (PT)    Recommendations for Other Services OT consult   Frequency 7X/week    Precautions / Restrictions Precautions Precautions: Knee Restrictions Weight Bearing Restrictions: Yes RLE Weight Bearing: Weight bearing as tolerated   Pertinent Vitals/Pain Pt reports numbness in knee, but not really pain patient repositioned for comfort and optimal knee extension      Mobility  Bed Mobility Bed Mobility: Supine to Sit;Sitting - Scoot to Edge of Bed Supine to Sit: 4: Min guard Sitting - Scoot to [place] of Bed: 4: Min guard Details for Bed Mobility Assistance: Cues for technique and safety Transfers Transfers: Sit to Stand;Stand to Sit Sit to Stand: 4: Min assist;From bed Stand to Sit: To chair/3-in-1;3: Mod assist Details for Transfer Assistance: Cues for technique, safety, and hand placement; Required phsyical assist to control  descent Ambulation/Gait Ambulation/Gait Assistance: 1: +2 Total assist Ambulation/Gait: Patient Percentage: 60% Ambulation Distance (Feet): 5 Feet Assistive device: Rolling walker Ambulation/Gait Assistance Details: Cues for gait sequence and to activate quad for stance stability R; Pt with significant knee buckle requiring blocking  Gait Pattern: Step-to pattern    Exercises Total Joint Exercises Ankle Circles/Pumps: AROM;Right;10 reps Quad Sets: AROM;Right;10 reps   PT Diagnosis: Difficulty walking;Abnormality of gait;Acute pain  PT Problem List: Decreased strength;Decreased range of motion;Decreased activity tolerance;Decreased balance;Decreased mobility;Decreased coordination;Decreased knowledge of use of DME;Pain;Decreased knowledge of precautions PT Treatment Interventions: DME instruction;Gait training;Stair training;Therapeutic activities;Functional mobility training;Therapeutic exercise;Patient/family education     PT Goals(Current goals can be found in the care plan section) Acute Rehab PT Goals Patient Stated Goal: back to work PT Goal Formulation: With patient Time For Goal Achievement: 02/25/13 Potential to Achieve Goals: Good  Visit Information  Last PT Received On: 02/18/13 Assistance Needed: +2 (hopefully +1 soon) History of Present Illness: s/p RTKA       Prior Functioning  Home Living Family/patient expects to be discharged to:: Private residence Living Arrangements: Alone Available Help at Discharge: Family;Friend(s);Other (Comment) (Pt is hoping to be able to arrange for 24 hour assist) Type of Home: House Home Access: Stairs to enter [employer] of Steps: 1 Entrance Stairs-Rails: None Home Layout: One level Home Equipment: Walker - 2 wheels;Toilet riser Prior Function Level of Independence: Independent Communication Communication: No difficulties    Cognition  Cognition Arousal/Alertness: Awake/alert Behavior During Therapy: WFL for  tasks assessed/performed Overall Cognitive Status: Within Functional Limits for tasks assessed    Extremity/Trunk Assessment Upper  Extremity Assessment Upper Extremity Assessment: Overall WFL for tasks assessed Lower Extremity Assessment Lower Extremity Assessment: RLE deficits/detail RLE Deficits / Details: Quad set present, though weak; significant knee buckling in standing   Balance    End of Session PT - End of Session Equipment Utilized During Treatment: Gait belt Activity Tolerance: Patient tolerated treatment well Patient left: in chair;with call bell/phone within reach   GP     Van Clines [facility] [place], [place] [phone]  02/18/2013, 4:18 PM

## 2013-02-18 NOTE — Anesthesia Preprocedure Evaluation (Addendum)
Anesthesia Evaluation  Patient identified by MRN, date of birth, ID band Patient awake    Reviewed: Allergy & Precautions, H&P , Patient's Chart, lab work & pertinent test results, reviewed documented beta blocker date and time   Airway Mallampati: I TM Distance: >3 FB Neck ROM: Full    Dental   Pulmonary neg pulmonary ROS,  02-12-13 Chest x-ray FINDINGS: Cardiomediastinal silhouette is unremarkable. Mild dextroscoliosis. Mild degenerative changes lower thoracic spine. Degenerative changes lumbar spine are noted. No acute infiltrate or pulmonary edema.   IMPRESSION: No active cardiopulmonary disease.  Dextroscoliosis.   breath sounds clear to auscultation  Pulmonary exam normal       Cardiovascular hypertension, Pt. on medications and Pt. on home beta blockers Rhythm:Regular Rate:Normal     Neuro/Psych  Headaches, Anxiety Depression negative neurological ROS     GI/Hepatic Neg liver ROS, GERD-  Medicated and Controlled,  Endo/Other  Hypothyroidism   Renal/GU negative Renal ROS     Musculoskeletal  (+) Arthritis -, Osteoarthritis,    Abdominal Normal abdominal exam  (+)   Peds  Hematology negative hematology ROS (+)   Anesthesia Other Findings Teeth intact  Reproductive/Obstetrics                       Anesthesia Physical Anesthesia Plan  ASA: II  Anesthesia Plan: General   Post-op Pain Management:    Induction: Intravenous  Airway Management Planned: LMA  Additional Equipment:   Intra-op Plan:   Post-operative Plan: Extubation in OR  Informed Consent: I have reviewed the patients History and Physical, chart, labs and discussed the procedure including the risks, benefits and alternatives for the proposed anesthesia with the patient or authorized representative who has indicated his/her understanding and acceptance.   Dental advisory given  Plan Discussed with: CRNA,  Anesthesiologist and Surgeon  Anesthesia Plan Comments:         Anesthesia Quick Evaluation

## 2013-02-19 ENCOUNTER — Encounter (HOSPITAL_COMMUNITY): Payer: Self-pay | Admitting: General Practice

## 2013-02-19 LAB — BASIC METABOLIC PANEL
BUN: 9 mg/dL (ref 6–23)
Calcium: 8.6 mg/dL (ref 8.4–10.5)
Creatinine, Ser: 0.7 mg/dL (ref 0.50–1.10)
GFR calc Af Amer: >90 mL/min (ref >90)
GFR calc non Af Amer: >90 mL/min (ref >90)
Glucose, Bld: 102 mg/dL — ABNORMAL HIGH (ref 70–99)

## 2013-02-19 LAB — CBC
HCT: 32.8 % — ABNORMAL LOW (ref 36.0–46.0)
Hemoglobin: 11.2 g/dL — ABNORMAL LOW (ref 12.0–15.0)
MCH: 31.7 pg (ref 26.0–34.0)
MCHC: 34.1 g/dL (ref 30.0–36.0)
MCV: 92.9 fL (ref 78.0–100.0)
Platelets: 206 10*3/uL (ref 150–400)
RDW: 13.6 % (ref 11.5–15.5)

## 2013-02-19 MED ORDER — OXYCODONE HCL 5 MG PO TABS: 5.0000 mg | ORAL_TABLET | ORAL | Status: DC | PRN

## 2013-02-19 MED ORDER — METHOCARBAMOL 500 MG PO TABS: 500.0000 mg | ORAL_TABLET | Freq: Four times a day (QID) | ORAL | Status: DC | PRN

## 2013-02-19 MED ORDER — OXYCODONE HCL ER 10 MG PO T12A: 10.0000 mg | EXTENDED_RELEASE_TABLET | Freq: Two times a day (BID) | ORAL | Status: DC

## 2013-02-19 MED ORDER — ENOXAPARIN SODIUM 40 MG/0.4ML ~~LOC~~ SOLN: 40.0000 mg | SUBCUTANEOUS | Status: DC

## 2013-02-19 NOTE — Evaluation (Signed)
Occupational Therapy Evaluation Patient Details Name: Ann Freeman MRN: [id number] DOB: November 09, 1950 Today's Date: 02/19/2013 Time: 6962-9528 OT Time Calculation (min): 38 min  OT Assessment / Plan / Recommendation History of present illness s/p RTKA   Clinical Impression   Pt is [name] s/p RTKA. PLOF: (I), working. Pt lives alone. Pt requesting assist to bathroom. Initially min guard for amb w/RW but progressed to close supervision. Pt (I) w/hygiene and Sup for balance during clothing mgmt. Pt stood at sink approx for sponge bath and grooming tasks. Pt c/o of fatigue at end of ADLs requesting back to bed. She was able to don/doff sock w/o physical assist or AE. Pt able to lift BLEs into bed w/o physical assist. Pt left in bed w/needs met, call light w/in reach.     OT Assessment  Patient needs continued OT Services (during acute care stay)    Follow Up Recommendations  No OT follow up    Barriers to Discharge      Equipment Recommendations  3 in 1 bedside comode    Recommendations for Other Services    Frequency  Min 1X/week    Precautions / Restrictions Precautions Precautions: Knee Restrictions Weight Bearing Restrictions: Yes RLE Weight Bearing: Weight bearing as tolerated   Pertinent Vitals/Pain 5/10 pain    ADL  Grooming: Performed Where Assessed - Grooming: Unsupported standing Upper Body Bathing: Performed Where Assessed - Upper Body Bathing: Unsupported standing Lower Body Bathing: Performed Where Assessed - Lower Body Bathing: Supported standing (unilater UE support on sink) Upper Body Dressing: Set up;Performed Where Assessed - Upper Body Dressing: Unsupported standing Toilet Transfer: Supervision/safety Toilet Transfer Method: Sit to stand;Stand pivot Acupuncturist: Raised toilet seat with arms (or 3-in-1 over toilet) Toileting - Clothing Manipulation and Hygiene: Modified independent Where Assessed - Glass blower/designer Manipulation and  Hygiene: Sit to stand from 3-in-1 or toilet Equipment Used: Rolling walker Transfers/Ambulation Related to ADLs: Pt at min guard/supervision level for fx'l mob/txfrs w/RW ADL Comments: Pt stood at sink at least 10 min for sponge bath & g/h tasks    OT Diagnosis: Generalized weakness;Acute pain  OT Problem List: Decreased activity tolerance;Decreased knowledge of use of DME or AE;Decreased safety awareness OT Treatment Interventions: Self-care/ADL training;Therapeutic activities;Patient/family education   OT Goals(Current goals can be found in the care plan section) Acute Rehab OT Goals Patient Stated Goal: return home; be (I) OT Goal Formulation: With patient Time For Goal Achievement: 03/02/13 ADL Goals Pt Will Perform Grooming: with modified independence;standing Pt Will Perform Lower Body Dressing: with modified independence Pt Will Transfer to Toilet: with modified independence Pt Will Perform Toileting - Clothing Manipulation and hygiene: with modified independence  Visit Information  Last OT Received On: 02/19/13 Assistance Needed: +1 History of Present Illness: s/p RTKA       Prior Functioning     Home Living Family/patient expects to be discharged to:: Private residence Living Arrangements: Alone Available Help at Discharge: Family;Friend(s);Other (Comment) (cousin can assist upon d/c; 24/7 for 1st  few days) Type of Home: House Home Access: Stairs to enter [employer] of Steps: 1 Entrance Stairs-Rails: None Home Layout: One level Home Equipment: Walker - 2 wheels;Bedside commode Prior Function Level of Independence: Independent Communication Communication: No difficulties         Vision/Perception     Cognition  Cognition Arousal/Alertness: Awake/alert Behavior During Therapy: WFL for tasks assessed/performed Overall Cognitive Status: Within Functional Limits for tasks assessed    Extremity/Trunk Assessment Upper Extremity  Assessment  Upper Extremity Assessment: Overall WFL for tasks assessed     Mobility Bed Mobility Bed Mobility: Supine to Sit;Sitting - Scoot to Edge of Bed Supine to Sit: 5: Supervision Sitting - Scoot to Edge of Bed: 5: Supervision Details for Bed Mobility Assistance: able to recall safety cues and technique Transfers Transfers: Sit to Stand;Stand to Sit Sit to Stand: From bed;4: Min guard;5: Supervision Stand to Sit: To chair/3-in-1;4: Min guard Details for Transfer Assistance: Able to recall cues for technique, safety & hand placement     Exercise Total Joint Exercises Ankle Circles/Pumps: AROM;Right;10 reps Quad Sets: AROM;Right;10 reps Long Arc Quad: AROM;AAROM;Right;5 reps Knee Flexion: AROM;Right;5 reps   Balance Balance Balance Assessed: Yes   End of Session CPM Right Knee CPM Right Knee: On Right Knee Flexion (Degrees): 60 Right Knee Extension (Degrees): 0  GO     Ann Freeman G 02/19/2013, 1:48 PM

## 2013-02-19 NOTE — Progress Notes (Signed)
Physical Therapy Treatment Patient Details Name: Ann Freeman MRN: [id number] DOB: 1950-08-28 Today's Date: 02/19/2013 Time: 4098-1191 PT Time Calculation (min): 23 min  PT Assessment / Plan / Recommendation  History of Present Illness s/p RTKA   PT Comments   Excellent progress with much better knee control in upright activity; Feel dc home definitely tomorrow, and possibly after second session, provided pt has increased activity tolerance   Follow Up Recommendations  Home health PT;Supervision/Assistance - 24 hour (Pt reports her cousin can stay with her 24 hours as long as necessary)     Does the patient have the potential to tolerate intense rehabilitation     Barriers to Discharge        Equipment Recommendations  Rolling walker with 5" wheels;3in1 (PT)    Recommendations for Other Services OT consult  Frequency 7X/week   Progress towards PT Goals Progress towards PT goals: Progressing toward goals  Plan Current plan remains appropriate    Precautions / Restrictions Precautions Precautions: Knee Restrictions Weight Bearing Restrictions: Yes RLE Weight Bearing: Weight bearing as tolerated   Pertinent Vitals/Pain 6/10 R knee pain; RN provided medication to assist with pain control elevated for edema and pain control     Mobility  Bed Mobility Bed Mobility: Supine to Sit;Sitting - Scoot to Edge of Bed Supine to Sit: 4: Min guard Sitting - Scoot to [place] of Bed: 4: Min guard Details for Bed Mobility Assistance: Cues for technique and safety Transfers Transfers: Sit to Stand;Stand to Sit Sit to Stand: 4: Min assist;From bed Stand to Sit: To chair/3-in-1;4: Min guard Details for Transfer Assistance: Cues for technique, safety, and hand placement Ambulation/Gait Ambulation/Gait Assistance: 4: Min assist Ambulation Distance (Feet): 20 Feet Assistive device: Rolling walker Ambulation/Gait Assistance Details: Cues for gait sequence and to activate quad for stance  stability; Min assist to move RW; no knee buckling today Gait Pattern: Step-to pattern    Exercises Total Joint Exercises Ankle Circles/Pumps: AROM;Right;10 reps Quad Sets: AROM;Right;10 reps Long Arc Quad: AROM;AAROM;Right;5 reps Knee Flexion: AROM;Right;5 reps   PT Diagnosis:    PT Problem List:   PT Treatment Interventions:     PT Goals (current goals can now be found in the care plan section) Acute Rehab PT Goals Patient Stated Goal: back to work PT Goal Formulation: With patient Time For Goal Achievement: 02/25/13 Potential to Achieve Goals: Good  Visit Information  Last PT Received On: 02/19/13 Assistance Needed: +1 History of Present Illness: s/p RTKA    Subjective Data  Subjective: Reports doesn't remember much about yesterday Patient Stated Goal: back to work   Cognition  Cognition Arousal/Alertness: Awake/alert Behavior During Therapy: WFL for tasks assessed/performed Overall Cognitive Status: Within Functional Limits for tasks assessed    Balance     End of Session PT - End of Session Equipment Utilized During Treatment: Gait belt Activity Tolerance: Patient tolerated treatment well Patient left: in chair;with call bell/phone within reach Nurse Communication: Other (comment) (O2 sats on room air greater than 94%) CPM Right Knee CPM Right Knee: On Right Knee Flexion (Degrees): 60 Right Knee Extension (Degrees): 0   GP     Van Clines [place], [place] [phone]  02/19/2013, 11:00 AM

## 2013-02-19 NOTE — Progress Notes (Signed)
SPORTS MEDICINE AND JOINT REPLACEMENT  Georgena Spurling, MD   Altamese Cabal, PA-C [address] [place], [place], [place]  [address]                             [phone]   PROGRESS NOTE  Subjective:  negative for Chest Pain  negative for Shortness of Breath  negative for Nausea/Vomiting   negative for Calf Pain  negative for Bowel Movement   Tolerating Diet: yes         Patient reports pain as 4 on 0-10 scale.    Objective: Vital signs in last 24 hours:   Patient Vitals for the past 24 hrs:  BP Temp Temp src Pulse Resp SpO2  02/19/13 0550 115/48 mmHg 98.5 F (36.9 C) Oral 64 18 98 %  02/19/13 0209 138/59 mmHg 98.6 F (37 C) Oral 66 18 98 %  02/18/13 2119 120/60 mmHg 98.6 F (37 C) Oral 66 18 96 %  02/18/13 1600 - - - - 16 95 %    @flow {1959:LAST@   Intake/Output from previous day:   11/03 0701 - 11/04 0700 In: 2745 [I.V.:2695] Out: 4775 [Urine:4350; Drains:375]   Intake/Output this shift:   11/04 0701 - 11/04 1900 In: 360 [P.O.:360] Out: -    Intake/Output     11/03 0701 - 11/04 0700 11/04 0701 - 11/05 0700   P.O.  360   I.V. 2695    IV Piggyback 50    Total Intake 2745 360   Urine 4350    Drains 375    Blood 50    Total Output 4775     Net -2030 +360           LABORATORY DATA:  Recent Labs  02/12/13 1554 02/18/13 1330 02/19/13 0500  WBC 7.9 10.6* 9.9  HGB 13.5 12.7 11.2*  HCT 39.4 36.6 32.8*  PLT 261 208 206    Recent Labs  02/12/13 1554 02/18/13 1330 02/19/13 0500  NA 137  --  138  K 3.8  --  3.5  CL 99  --  104  CO2 28  --  25  BUN 13  --  9  CREATININE 0.74 0.74 0.70  GLUCOSE 87  --  102*  CALCIUM 9.9  --  8.6   Lab Results  Component Value Date   INR 0.98 02/12/2013    Examination:  General appearance: alert, cooperative and no distress Extremities: extremities normal, atraumatic, no cyanosis or edema and Homans sign is negative, no sign of DVT  Wound Exam: clean, dry, intact   Drainage:  Scant/small amount  Serosanguinous exudate  Motor Exam: EHL and FHL Intact  Sensory Exam: Deep Peroneal normal   Assessment:    1 Day Post-Op  Procedure(s) (LRB): TOTAL KNEE ARTHROPLASTY- RIGHT (Right)  ADDITIONAL DIAGNOSIS:  Active Problems:   * No active hospital problems. *  Acute Blood Loss Anemia   Plan: Physical Therapy as ordered Weight Bearing as Tolerated (WBAT)  DVT Prophylaxis:  Lovenox  DISCHARGE PLAN: Home  DISCHARGE NEEDS: HHPT, CPM, Walker and 3-in-1 comode seat         Lashawn Orrego 02/19/2013, 12:31 PM

## 2013-02-19 NOTE — Progress Notes (Signed)
Physical Therapy Treatment Patient Details Name: Ann Freeman MRN: [id number] DOB: April 12, 1951 Today's Date: 02/19/2013 Time: 4098-1191 PT Time Calculation (min): 20 min  PT Assessment / Plan / Recommendation  History of Present Illness s/p RTKA   PT Comments   Making very good progress; On track for dc home tomorrow  Follow Up Recommendations  Home health PT;Supervision/Assistance - 24 hour     Does the patient have the potential to tolerate intense rehabilitation     Barriers to Discharge        Equipment Recommendations  Rolling walker with 5" wheels;3in1 (PT)    Recommendations for Other Services    Frequency 7X/week   Progress towards PT Goals Progress towards PT goals: Progressing toward goals  Plan Current plan remains appropriate    Precautions / Restrictions Precautions Precautions: Knee Restrictions Weight Bearing Restrictions: Yes RLE Weight Bearing: Weight bearing as tolerated   Pertinent Vitals/Pain 5/10 pain; RNnotified    Mobility  Bed Mobility Bed Mobility: Supine to Sit;Sitting - Scoot to Edge of Bed Supine to Sit: 5: Supervision Sitting - Scoot to Edge of Bed: 5: Supervision Details for Bed Mobility Assistance: able to recall safety cues and technique Transfers Transfers: Sit to Stand;Stand to Sit Sit to Stand: From bed;5: Supervision Stand to Sit: To chair/3-in-1;5: Supervision Details for Transfer Assistance: Able to recall cues for technique, safety & hand placement Ambulation/Gait Ambulation/Gait Assistance: 5: Supervision Ambulation Distance (Feet): 10 Feet (to chair) Assistive device: Rolling walker Ambulation/Gait Assistance Details: Good R stance stability Gait Pattern: Step-to pattern;Step-through pattern (emerging step-through)    Exercises Total Joint Exercises Quad Sets: AROM;Right;10 reps Short Arc Quad: AAROM;AROM;Right;10 reps Heel Slides: AAROM;Right;10 reps Straight Leg Raises: AROM;Right;10 reps Goniometric ROM: at least  80deg   PT Diagnosis:    PT Problem List:   PT Treatment Interventions:     PT Goals (current goals can now be found in the care plan section) Acute Rehab PT Goals Patient Stated Goal: back to work PT Goal Formulation: With patient Time For Goal Achievement: 02/25/13 Potential to Achieve Goals: Good  Visit Information  Last PT Received On: 02/19/13 Assistance Needed: +1 History of Present Illness: s/p RTKA    Subjective Data  Subjective: Seems pleased with progress Patient Stated Goal: back to work   Cognition  Cognition Arousal/Alertness: Awake/alert Behavior During Therapy: WFL for tasks assessed/performed Overall Cognitive Status: Within Functional Limits for tasks assessed    Balance  Balance Balance Assessed: Yes  End of Session PT - End of Session Activity Tolerance: Patient tolerated treatment well Patient left: in chair;with call bell/phone within reach;with family/visitor present Nurse Communication: Mobility status   GP     Ann Freeman Ann Freeman, Ann Freeman [phone]  02/19/2013, 4:40 PM

## 2013-02-19 NOTE — Progress Notes (Signed)
Placed pt's rle in cpm @0 -60 degreesOrthopedic Tech Progress Note Patient Details:  Ann Freeman April 26, 1950 [id number]  Patient ID: Ann Freeman, female   DOB: 15-Jun-1950, 62 y.o.   MRN: [id number] Placed pt's rle in cpm @ 0-60 degrees  Ann Freeman 02/19/2013, 2:51 PM

## 2013-02-19 NOTE — Progress Notes (Signed)
02/19/13 Set up with HHPT with [facility] by MD office. Contacted [facility], spoke with [facility] and verified that they are set up for HHPT. She requested H and P and op note be faxed to [phone]. Faxed requested information and received confirmation. Spoke with patient, no change in d/c plan. [employer] providing CPM, rolling walker and 3N1. Will continue to follow for d/c needs. Jacquelynn Cree RN, BSN, CCM

## 2013-02-20 LAB — CBC
Hemoglobin: 10.4 g/dL — ABNORMAL LOW (ref 12.0–15.0)
MCH: 31.4 pg (ref 26.0–34.0)
MCHC: 33.3 g/dL (ref 30.0–36.0)
Platelets: 217 10*3/uL (ref 150–400)
RBC: 3.31 MIL/uL — ABNORMAL LOW (ref 3.87–5.11)
RDW: 13.9 % (ref 11.5–15.5)
WBC: 9.7 10*3/uL (ref 4.0–10.5)

## 2013-02-20 LAB — BASIC METABOLIC PANEL
BUN: 11 mg/dL (ref 6–23)
Calcium: 8.5 mg/dL (ref 8.4–10.5)
Chloride: 97 mEq/L (ref 96–112)
GFR calc Af Amer: 85 mL/min — ABNORMAL LOW (ref >90)
GFR calc non Af Amer: 73 mL/min — ABNORMAL LOW (ref >90)
Glucose, Bld: 128 mg/dL — ABNORMAL HIGH (ref 70–99)
Potassium: 3.6 mEq/L (ref 3.5–5.1)
Sodium: 132 mEq/L — ABNORMAL LOW (ref 135–145)

## 2013-02-20 NOTE — Progress Notes (Signed)
Physical Therapy Treatment Patient Details Name: Ann Freeman MRN: [id number] DOB: Sep 06, 1950 Today's Date: 02/20/2013 Time: 4098-1191 PT Time Calculation (min): 34 min  PT Assessment / Plan / Recommendation  History of Present Illness s/p RTKA   PT Comments   Managing well; R knee nice and stable in stance; OK for dc home from PT standpoint  Follow Up Recommendations  Home health PT;Supervision/Assistance - 24 hour     Does the patient have the potential to tolerate intense rehabilitation     Barriers to Discharge        Equipment Recommendations  Rolling walker with 5" wheels;3in1 (PT)    Recommendations for Other Services    Frequency 7X/week   Progress towards PT Goals    Plan Current plan remains appropriate    Precautions / Restrictions Precautions Precautions: Knee Restrictions Weight Bearing Restrictions: Yes RLE Weight Bearing: Weight bearing as tolerated   Pertinent Vitals/Pain 6/10 R knee; patient repositioned for comfort and optimal knee ext    Mobility  Bed Mobility Bed Mobility: Supine to Sit;Sitting - Scoot to Edge of Bed Supine to Sit: 5: Supervision Sitting - Scoot to Edge of Bed: 5: Supervision Sit to Supine: 4: Min assist (for R LE) Details for Bed Mobility Assistance: able to recall safety cues and technique Transfers Transfers: Sit to Stand;Stand to Sit Sit to Stand: From bed;5: Supervision;From chair/3-in-1;With armrests Stand to Sit: To chair/3-in-1;5: Supervision Details for Transfer Assistance: Able to recall cues for technique, safety & hand placement; is working on allowing more knee ROM with transition movements Ambulation/Gait Ambulation/Gait Assistance: 5: Supervision Ambulation Distance (Feet): 260 Feet Assistive device: Rolling walker Ambulation/Gait Assistance Details: Continued good R stance stability; progressed to step-through Gait Pattern: Step-to pattern;Step-through pattern (emerging step-through) Stairs: Yes Stairs  Assistance: 4: Min guard Stair Management Technique: No rails;With walker;Forwards;Backwards Number of Stairs: 1 (x2; verbal and demo cues for technique and sequence)    Exercises     PT Diagnosis:    PT Problem List:   PT Treatment Interventions:     PT Goals (current goals can now be found in the care plan section) Acute Rehab PT Goals Patient Stated Goal: back to work PT Goal Formulation: With patient Time For Goal Achievement: 02/25/13 Potential to Achieve Goals: Good  Visit Information  Last PT Received On: 02/20/13 Assistance Needed: +1 History of Present Illness: s/p RTKA    Subjective Data  Subjective: Wanting to get intouch with her daughter re: dc today Patient Stated Goal: back to work   Copywriter, advertising Arousal/Alertness: Awake/alert Behavior During Therapy: WFL for tasks assessed/performed Overall Cognitive Status: Within Functional Limits for tasks assessed    Balance     End of Session PT - End of Session Activity Tolerance: Patient tolerated treatment well Patient left: in chair;with call bell/phone within reach;with family/visitor present Nurse Communication: Mobility status   GP     [name] [place], [place] [phone]  02/20/2013, 10:43 AM

## 2013-02-20 NOTE — Progress Notes (Signed)
SPORTS MEDICINE AND JOINT REPLACEMENT  Georgena Spurling, MD   Altamese Cabal, PA-C [address] [place], [place], [place]  [address]                             [phone]   PROGRESS NOTE  Subjective:  negative for Chest Pain  negative for Shortness of Breath  negative for Nausea/Vomiting   negative for Calf Pain  negative for Bowel Movement   Tolerating Diet: yes         Patient reports pain as 5 on 0-10 scale.    Objective: Vital signs in last 24 hours:   Patient Vitals for the past 24 hrs:  BP Temp Temp src Pulse Resp SpO2  02/20/13 0625 136/53 mmHg 99.7 F (37.6 C) Oral 69 18 100 %  02/20/13 0000 - - - - 18 100 %  02/19/13 2231 125/48 mmHg 100.2 F (37.9 C) Oral 77 16 94 %  02/19/13 2000 - - - - 18 96 %    @flow {1959:LAST@   Intake/Output from previous day:   11/04 0701 - 11/05 0700 In: 1205 [P.O.:1205] Out: -    Intake/Output this shift:   11/05 0701 - 11/05 1900 In: 240 [P.O.:240] Out: -    Intake/Output     11/04 0701 - 11/05 0700 11/05 0701 - 11/06 0700   P.O. 1205 240   I.V.     IV Piggyback     Total Intake 1205 240   Urine     Drains     Blood     Total Output       Net +1205 +240        Urine Occurrence 4 x 1 x      LABORATORY DATA:  Recent Labs  02/18/13 1330 02/19/13 0500 02/20/13 0427  WBC 10.6* 9.9 9.7  HGB 12.7 11.2* 10.4*  HCT 36.6 32.8* 31.2*  PLT 208 206 217    Recent Labs  02/18/13 1330 02/19/13 0500 02/20/13 0427  NA  --  138 132*  K  --  3.5 3.6  CL  --  104 97  CO2  --  25 26  BUN  --  9 11  CREATININE 0.74 0.70 0.83  GLUCOSE  --  102* 128*  CALCIUM  --  8.6 8.5   Lab Results  Component Value Date   INR 0.98 02/12/2013    Examination:  General appearance: alert, cooperative and no distress Extremities: Homans sign is negative, no sign of DVT  Wound Exam: clean, dry, intact   Drainage:  None: wound tissue dry  Motor Exam: EHL and FHL Intact  Sensory Exam: Deep Peroneal normal   Assessment:     2 Days Post-Op  Procedure(s) (LRB): TOTAL KNEE ARTHROPLASTY- RIGHT (Right)  ADDITIONAL DIAGNOSIS:  Active Problems:   * No active hospital problems. *  Acute Blood Loss Anemia   Plan: Physical Therapy as ordered Weight Bearing as Tolerated (WBAT)  DVT Prophylaxis:  Lovenox  DISCHARGE PLAN: Home  DISCHARGE NEEDS: HHPT, CPM, Walker and 3-in-1 comode seat         Ann Freeman 02/20/2013, 1:41 PM

## 2013-02-20 NOTE — Progress Notes (Signed)
Occupational Therapy Treatment Patient Details Name: Ann Freeman MRN: [id number] DOB: Jul 10, 1950 Today's Date: 02/20/2013 Time: 8295-6213 OT Time Calculation (min): 20 min  OT Assessment / Plan / Recommendation  History of present illness s/p RTKA   OT comments  Talked through obstacles pt may encounter at home with showering and IADL.  Pt stating,"I never thought of those things." Pt will have cousin staying with her initially, may need intermittent assist for household tasks for a while beyond that .    Follow Up Recommendations  No OT follow up;Supervision/Assistance - 24 hour    Barriers to Discharge       Equipment Recommendations  3 in 1 bedside comode    Recommendations for Other Services    Frequency Min 2X/week   Progress towards OT Goals Progress towards OT goals: Progressing toward goals  Plan Discharge plan remains appropriate    Precautions / Restrictions Precautions Precautions: Knee;Fall Restrictions Weight Bearing Restrictions: Yes RLE Weight Bearing: Weight bearing as tolerated   Pertinent Vitals/Pain R knee, did not rate, premedicated, repositioned    ADL  Grooming: Wash/dry hands;Wash/dry face;Brushing hair;Supervision/safety Where Assessed - Grooming: Unsupported standing Toilet Transfer: Radiographer, therapeutic Method: Sit to Barista: Raised toilet seat with arms (or 3-in-1 over toilet) Toileting - Clothing Manipulation and Hygiene: Modified independent Where Assessed - Toileting Clothing Manipulation and Hygiene: Sit to stand from 3-in-1 or toilet Equipment Used: Reacher;Rolling walker;Long-handled sponge Transfers/Ambulation Related to ADLs: supervision with RW ADL Comments: Pt does not have the mobility to step over edge of tub at this point.  Educated her in use of tub transfer bench.  Pt declined, will sponge bathe and wash her hair in the kitchen sink.  Recommended pt do a "dry run" with HHPT prior to  showering.  Discussed other options for shower seats.  Instructed pt in transporting items with RW.  Educated pt in use of reacher for IADL and recommended long sponge and supervision when initially showering for safety.  Instructed in make-shift walker bag.  Discussed options for feeding dog.    OT Diagnosis:    OT Problem List:   OT Treatment Interventions:     OT Goals(current goals can now be found in the care plan section) Acute Rehab OT Goals Patient Stated Goal: back to work  Visit Information  Last OT Received On: 02/20/13 Assistance Needed: +1 History of Present Illness: s/p RTKA    Subjective Data      Prior Functioning  Home Living Family/patient expects to be discharged to:: Private residence Living Arrangements: Alone    Cognition  Cognition Arousal/Alertness: Suspect due to medications;Lethargic Behavior During Therapy: WFL for tasks assessed/performed Overall Cognitive Status: Within Functional Limits for tasks assessed    Mobility  Bed Mobility Bed Mobility: Sit to Supine Sit to Supine: 4: Min assist (for R LE) Transfers Transfers: Sit to Stand;Stand to Sit Sit to Stand: 5: Supervision Stand to Sit: 5: Supervision    Exercises      Balance     End of Session OT - End of Session Activity Tolerance: Patient limited by lethargy (pt sleepy) Patient left: in bed;with call bell/phone within reach  GO     Evern Bio 02/20/2013, 10:25 AM [phone]

## 2013-02-20 NOTE — Discharge Summary (Signed)
SPORTS MEDICINE & JOINT REPLACEMENT   Georgena Spurling, MD   Altamese Cabal, PA-C [address] [place], [place], [place]  [address]                             [phone]  PATIENT ID: Ann Freeman        MRN:  [id number]          DOB/AGE: [birth year] / 62 y.o.    DISCHARGE SUMMARY  ADMISSION DATE:    02/18/2013 DISCHARGE DATE:   02/20/2013   ADMISSION DIAGNOSIS: osteoarthritis right knee    DISCHARGE DIAGNOSIS:  osteoarthritis right knee    ADDITIONAL DIAGNOSIS: Active Problems:   * No active hospital problems. *  Past Medical History  Diagnosis Date  . Hypertension   . Anxiety   . Hypothyroidism   . Depression     has been hospitalized in the past  . H/O bladder infections   . GERD (gastroesophageal reflux disease)     in the past, not treated for it at present  . Headache(784.0)     Migraines  . Arthritis     PROCEDURE: Procedure(s): TOTAL KNEE ARTHROPLASTY- RIGHT on 02/18/2013  CONSULTS:     HISTORY:  See H&P in chart  HOSPITAL COURSE:  Rayel Santizo is a 62 y.o. admitted on 02/18/2013 and found to have a diagnosis of osteoarthritis right knee.  After appropriate laboratory studies were obtained  they were taken to the operating room on 02/18/2013 and underwent Procedure(s): TOTAL KNEE ARTHROPLASTY- RIGHT.   They were given perioperative antibiotics:  Anti-infectives   Start     Dose/Rate Route Frequency Ordered Stop   02/18/13 1500  ceFAZolin (ANCEF) IVPB 1 g/50 mL premix     1 g 100 mL/hr over 30 Minutes Intravenous Every 6 hours 02/18/13 1232 02/18/13 2043   02/18/13 0737  ceFAZolin (ANCEF) 2-3 GM-% IVPB SOLR    Comments:  Scronce, Trina   : cabinet override      02/18/13 0737 02/18/13 1944   02/18/13 0600  ceFAZolin (ANCEF) IVPB 2 g/50 mL premix     2 g 100 mL/hr over 30 Minutes Intravenous On call to O.R. 02/17/13 1441 02/18/13 0850    .  Tolerated the procedure well.  Placed with a foley intraoperatively.  Given Ofirmev at induction and for 48 hours.     POD# 1: Vital signs were stable.  Patient denied Chest pain, shortness of breath, or calf pain.  Patient was started on Lovenox 30 mg subcutaneously twice daily at 8am.  Consults to PT, OT, and care management were made.  The patient was weight bearing as tolerated.  CPM was placed on the operative leg 0-90 degrees for 6-8 hours a day.  Incentive spirometry was taught.  Dressing was changed.  Marcaine pump and hemovac were discontinued.      POD #2, Continued  PT for ambulation and exercise program.  IV saline locked.  O2 discontinued.    The remainder of the hospital course was dedicated to ambulation and strengthening.   The patient was discharged on 2 Days Post-Op in  Good condition.  Blood products given:none  DIAGNOSTIC STUDIES: Recent vital signs: Patient Vitals for the past 24 hrs:  BP Temp Temp src Pulse Resp SpO2  02/20/13 0625 136/53 mmHg 99.7 F (37.6 C) Oral 69 18 100 %  02/20/13 0000 - - - - 18 100 %  02/19/13 2231 125/48 mmHg 100.2 F (37.9  C) Oral 77 16 94 %  02/19/13 2000 - - - - 18 96 %       Recent laboratory studies:  Recent Labs  02/18/13 1330 02/19/13 0500 02/20/13 0427  WBC 10.6* 9.9 9.7  HGB 12.7 11.2* 10.4*  HCT 36.6 32.8* 31.2*  PLT 208 206 217    Recent Labs  02/18/13 1330 02/19/13 0500 02/20/13 0427  NA  --  138 132*  K  --  3.5 3.6  CL  --  104 97  CO2  --  25 26  BUN  --  9 11  CREATININE 0.74 0.70 0.83  GLUCOSE  --  102* 128*  CALCIUM  --  8.6 8.5   Lab Results  Component Value Date   INR 0.98 02/12/2013     Recent Radiographic Studies :  Dg Chest 2 View  02/12/2013   CLINICAL DATA:  Preop  EXAM: CHEST  2 VIEW  COMPARISON:  None  FINDINGS: Cardiomediastinal silhouette is unremarkable. Mild dextroscoliosis. Mild degenerative changes lower thoracic spine. Degenerative changes lumbar spine are noted. No acute infiltrate or pulmonary edema.  IMPRESSION: No active cardiopulmonary disease.  Dextroscoliosis.   Electronically Signed    By: Natasha Mead M.D.   On: 02/12/2013 16:14    DISCHARGE INSTRUCTIONS: Discharge Orders   Future Orders Complete By Expires   Call MD / Call 911  As directed    Comments:     If you experience chest pain or shortness of breath, CALL 911 and be transported to the hospital emergency room.  If you develope a fever above 101 F, pus (white drainage) or increased drainage or redness at the wound, or calf pain, call your surgeon's office.   Change dressing  As directed    Comments:     Change dressing on Thursday, then change the dressing daily with sterile 4 x 4 inch gauze dressing and apply TED hose.   Constipation Prevention  As directed    Comments:     Drink plenty of fluids.  Prune juice may be helpful.  You may use a stool softener, such as Colace (over the counter) 100 mg twice a day.  Use MiraLax (over the counter) for constipation as needed.   CPM  As directed    Comments:     Continuous passive motion machine (CPM):      Use the CPM from 0 to 90 for 6-8 hours per day.      You may increase by 10 per day.  You may break it up into 2 or 3 sessions per day.      Use CPM for 2 weeks or until you are told to stop.   Diet - low sodium heart healthy  As directed    Do not put a pillow under the knee. Place it under the heel.  As directed    Driving restrictions  As directed    Comments:     No driving for 6 weeks   Increase activity slowly as tolerated  As directed    Lifting restrictions  As directed    Comments:     No lifting for 6 weeks   TED hose  As directed    Comments:     Use stockings (TED hose) for 3 weeks on both leg(s).  You may remove them at night for sleeping.      DISCHARGE MEDICATIONS:     Medication List    STOP taking these medications  Fish Oil 1200 MG Caps      TAKE these medications       bisoprolol-hydrochlorothiazide 5-6.25 MG per tablet  Commonly known as:  ZIAC  Take 1 tablet by mouth daily.     enoxaparin 40 MG/0.4ML injection  Commonly  known as:  LOVENOX  Inject 0.4 mLs (40 mg total) into the skin daily.     levothyroxine 112 MCG tablet  Commonly known as:  SYNTHROID, LEVOTHROID  Take 112 mcg by mouth daily before breakfast.     methocarbamol 500 MG tablet  Commonly known as:  ROBAXIN  Take 1-2 tablets (500-1,000 mg total) by mouth every 6 (six) hours as needed for muscle spasms.     naproxen 500 MG tablet  Commonly known as:  NAPROSYN  Take 500 mg by mouth 2 (two) times daily as needed (for pain).     NASACORT AQ 55 MCG/ACT Aero nasal inhaler  Generic drug:  triamcinolone  Place 2 sprays into the nose daily as needed (for allergies).     oxyCODONE 5 MG immediate release tablet  Commonly known as:  Oxy IR/ROXICODONE  Take 1-2 tablets (5-10 mg total) by mouth every 3 (three) hours as needed for breakthrough pain.     OxyCODONE 10 mg T12a 12 hr tablet  Commonly known as:  OXYCONTIN  Take 1 tablet (10 mg total) by mouth every 12 (twelve) hours.     sertraline 100 MG tablet  Commonly known as:  ZOLOFT  Take 100 mg by mouth daily.     simvastatin 40 MG tablet  Commonly known as:  ZOCOR  Take 40 mg by mouth every evening.        FOLLOW UP VISIT:       Follow-up Information   Follow up with Raymon Mutton, MD. Call on 03/05/2013.   Specialty:  Orthopedic Surgery   Contact information:   [address] [place] [address] [phone]       DISPOSITION: HOME   CONDITION:  Good   Dominico Rod 02/20/2013, 1:44 PM

## 2013-02-20 NOTE — Progress Notes (Signed)
RN reviewed discharge instructions with patient and daughter. All questions answered. Patient given prescriptions and then volunteers came with a wheelchair to take patient to daughters car.

## 2017-12-21 ENCOUNTER — Encounter (INDEPENDENT_AMBULATORY_CARE_PROVIDER_SITE_OTHER): Payer: Self-pay | Admitting: Internal Medicine

## 2017-12-21 ENCOUNTER — Encounter (INDEPENDENT_AMBULATORY_CARE_PROVIDER_SITE_OTHER): Payer: Self-pay | Admitting: *Deleted

## 2017-12-21 ENCOUNTER — Ambulatory Visit (INDEPENDENT_AMBULATORY_CARE_PROVIDER_SITE_OTHER): Payer: Medicare Other | Admitting: Internal Medicine

## 2017-12-21 VITALS — BP 130/82 | HR 67 | Resp 18 | Ht 64.0 in | Wt 198.2 lb

## 2017-12-21 DIAGNOSIS — R1310 Dysphagia, unspecified: Secondary | ICD-10-CM | POA: Diagnosis not present

## 2017-12-21 DIAGNOSIS — K8500 Idiopathic acute pancreatitis without necrosis or infection: Secondary | ICD-10-CM

## 2017-12-21 DIAGNOSIS — K859 Acute pancreatitis without necrosis or infection, unspecified: Secondary | ICD-10-CM

## 2017-12-21 DIAGNOSIS — R1319 Other dysphagia: Secondary | ICD-10-CM

## 2017-12-21 HISTORY — DX: Acute pancreatitis without necrosis or infection, unspecified: K85.90

## 2017-12-21 LAB — COMPLETE METABOLIC PANEL WITH GFR
AG Ratio: 1.8 (calc) (ref 1.0–2.5)
ALBUMIN MSPROF: 4.5 g/dL (ref 3.6–5.1)
ALKALINE PHOSPHATASE (APISO): 62 U/L (ref 33–130)
ALT: 17 U/L (ref 6–29)
AST: 21 U/L (ref 10–35)
BILIRUBIN TOTAL: 0.6 mg/dL (ref 0.2–1.2)
BUN: 15 mg/dL (ref 7–25)
CHLORIDE: 102 mmol/L (ref 98–110)
CO2: 30 mmol/L (ref 20–32)
Calcium: 10 mg/dL (ref 8.6–10.4)
Creat: 0.77 mg/dL (ref 0.50–0.99)
GFR, Est African American: 92 mL/min/{1.73_m2} (ref >=60)
GFR, Est Non African American: 79 mL/min/{1.73_m2} (ref >=60)
GLUCOSE: 90 mg/dL (ref 65–139)
Globulin: 2.5 g/dL (calc) (ref 1.9–3.7)
POTASSIUM: 5 mmol/L (ref 3.5–5.3)
SODIUM: 139 mmol/L (ref 135–146)
Total Protein: 7 g/dL (ref 6.1–8.1)

## 2017-12-21 LAB — LIPASE: Lipase: 36 U/L (ref 7–60)

## 2017-12-21 LAB — AMYLASE: Amylase: 48 U/L (ref 21–101)

## 2017-12-21 MED ORDER — PANTOPRAZOLE SODIUM 40 MG PO TBEC: 40.0000 mg | DELAYED_RELEASE_TABLET | Freq: Every day | ORAL | 3 refills | Status: AC

## 2017-12-21 NOTE — Progress Notes (Signed)
Subjective:    Patient ID: Ann Freeman, female    DOB: 07-May-1950, 67 y.o.   MRN: [id number]  HPI Referred by Dr. Jonelle Sports  for epigastric pain . Seen in their office and sent to the ED on 11/28/2017. CT scan revealed findings most consistent with acute pancreatitis involving the pancreatic head.   She was not admitted. She was advised to stay on a soft diet for a few days. Had not been on any new medicines. She has one glass of one at night. Her pain resolved 2 days after she was seen in the ED. She says she had had the pain 4 days prior to going to the Dr.  Pain was almost unbearable. She has no prior hx of pancreatitis.  She also tells me she is having some dysphagia x 6 months.  Chicken and meats feel like they are lodging. She has to wash them down.  Appetite is good. No weight loss BMs are normal. Has a BM every 2 days. Previous patient of Dr. Kirby Crigler.     Review of Systems Past Medical History:  Diagnosis Date  . Anxiety   . Arthritis   . Depression    has been hospitalized in the past  . GERD (gastroesophageal reflux disease)    in the past, not treated for it at present  . H/O bladder infections   . Headache(784.0)    Migraines  . Hypertension   . Hypothyroidism   . Pancreatitis, acute 12/21/2017    Past Surgical History:  Procedure Laterality Date  . BREAST SURGERY Right    biopsy x 2  . chin implant  6 years ago  . COLONOSCOPY  2011   Dr. Elder Cyphers in [place]  . DILATION AND CURETTAGE OF UTERUS    . EYE SURGERY     cataract surgery with implants in both eye  . TONSILLECTOMY    . TOTAL KNEE ARTHROPLASTY Right 02/18/2013   Dr Sherlean Foot  . TOTAL KNEE ARTHROPLASTY Right 02/18/2013   Procedure: TOTAL KNEE ARTHROPLASTY- RIGHT;  Surgeon: Dannielle Huh, MD;  Location: [facility] OR;  Service: Orthopedics;  Laterality: Right;  . TUBAL LIGATION    . VAGINAL HYSTERECTOMY      Allergies  Allergen Reactions  . Sulfa Antibiotics Hives and Swelling    Current Outpatient  Medications on File Prior to Visit  Medication Sig Dispense Refill  . bisoprolol-hydrochlorothiazide (ZIAC) 5-6.25 MG per tablet Take 1 tablet by mouth daily.    . Cholecalciferol (VITAMIN D-3 PO) Take 2,000 Units by mouth daily.    . cycloSPORINE (RESTASIS OP) Apply 1 drop to eye 2 (two) times daily.    Marland Kitchen levothyroxine (SYNTHROID, LEVOTHROID) 112 MCG tablet Take 137 mcg by mouth daily before breakfast.     . naproxen (NAPROSYN) 500 MG tablet Take 500 mg by mouth daily.     . Omega-3 Fatty Acids (FISH OIL BURP-LESS) 1200 MG CAPS Take by mouth 4 (four) times daily.    . sertraline (ZOLOFT) 100 MG tablet Take 100 mg by mouth daily.    . simvastatin (ZOCOR) 40 MG tablet Take 40 mg by mouth every evening.    . triamcinolone (NASACORT AQ) 55 MCG/ACT AERO nasal inhaler Place 2 sprays into the nose daily as needed (for allergies).     No current facility-administered medications on file prior to visit.         Objective:   Physical Exam Blood pressure 130/82, pulse 67, resp. rate 18, height 5\' 4"  (1.626 m), weight  198 lb 3.2 oz (89.9 kg). Alert and oriented. Skin warm and dry. Oral mucosa is moist.   . Sclera anicteric, conjunctivae is pink. Thyroid not enlarged. No cervical lymphadenopathy. Lungs clear. Heart regular rate and rhythm.  Abdomen is soft. Bowel sounds are positive. No hepatomegaly. No abdominal masses felt. No Slight epigastric tenderness.  No edema to lower extremities.  .         Assessment & Plan:  Pancreatitis. She is asymptomatic now. Am going to get a f/u CT. CBC, Amylase, Cmet Dysphagia: DG esophagram.

## 2017-12-21 NOTE — Patient Instructions (Signed)
CT abdomen/pelvis with CM DG esophagram Rx for Protonix sent to her pharmacy.

## 2017-12-25 ENCOUNTER — Ambulatory Visit (HOSPITAL_COMMUNITY)
Admission: RE | Admit: 2017-12-25 | Discharge: 2017-12-25 | Disposition: A | Discharge status: Home / Self Care | Payer: Medicare Other | Source: Ambulatory Visit | Attending: Internal Medicine | Admitting: Internal Medicine

## 2017-12-25 DIAGNOSIS — R1310 Dysphagia, unspecified: Secondary | ICD-10-CM | POA: Insufficient documentation

## 2017-12-25 DIAGNOSIS — R1319 Other dysphagia: Secondary | ICD-10-CM

## 2018-01-08 ENCOUNTER — Ambulatory Visit (HOSPITAL_COMMUNITY)
Admission: RE | Admit: 2018-01-08 | Discharge: 2018-01-08 | Disposition: A | Discharge status: Home / Self Care | Payer: Medicare Other | Source: Ambulatory Visit | Attending: Internal Medicine | Admitting: Internal Medicine

## 2018-01-08 DIAGNOSIS — I700 Atherosclerosis of aorta: Secondary | ICD-10-CM | POA: Insufficient documentation

## 2018-01-08 DIAGNOSIS — M5136 Other intervertebral disc degeneration, lumbar region: Secondary | ICD-10-CM | POA: Diagnosis not present

## 2018-01-08 DIAGNOSIS — K8500 Idiopathic acute pancreatitis without necrosis or infection: Secondary | ICD-10-CM | POA: Insufficient documentation

## 2018-01-08 MED ORDER — IOPAMIDOL (ISOVUE-300) INJECTION 61%
100.0000 mL | Freq: Once | INTRAVENOUS | Status: AC | PRN
  Administered 2018-01-08: 100 mL via INTRAVENOUS

## 2018-01-09 ENCOUNTER — Other Ambulatory Visit (INDEPENDENT_AMBULATORY_CARE_PROVIDER_SITE_OTHER): Payer: Self-pay | Admitting: Internal Medicine

## 2018-01-09 ENCOUNTER — Ambulatory Visit
Admission: RE | Admit: 2018-01-09 | Discharge: 2018-01-09 | Disposition: A | Discharge status: Home / Self Care | Payer: Self-pay | Source: Ambulatory Visit | Attending: Internal Medicine | Admitting: Internal Medicine

## 2018-01-09 DIAGNOSIS — K859 Acute pancreatitis without necrosis or infection, unspecified: Secondary | ICD-10-CM

## 2019-10-20 IMAGING — CT CT ABD-PELV W/ CM
2 of 5 series · 16 of 46 positions shown, 18 images · IV contrast (Isovue)
Comparison: None available currently.

CLINICAL DATA: Pancreatitis.

EXAM:
CT ABDOMEN AND PELVIS WITH CONTRAST
TECHNIQUE: Multidetector CT imaging of the abdomen and pelvis was performed
using the standard protocol following bolus administration of
intravenous contrast.
CONTRAST:  100mL A7AYJC-PMM IOPAMIDOL (A7AYJC-PMM) INJECTION 61%

[Series 2: axial st · axial · 0.74mm/px · z∈[-597,-217]mm · 13 of 86 slices shown, 15 images]
[im 5/86  soft-tissue]
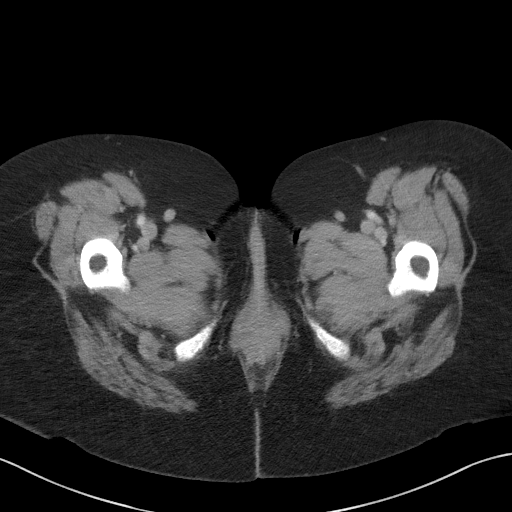
[im 5/86  bone]
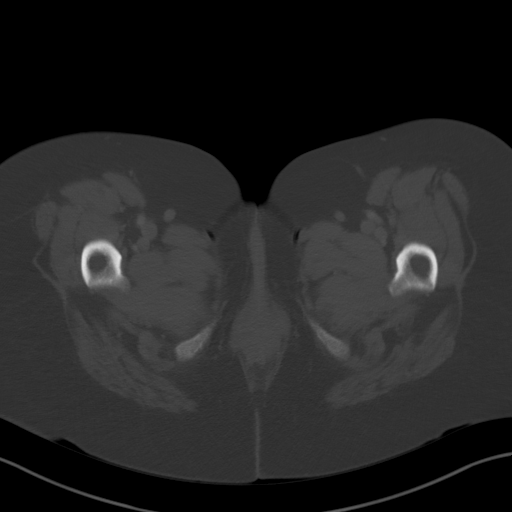
[im 10/86  soft-tissue]
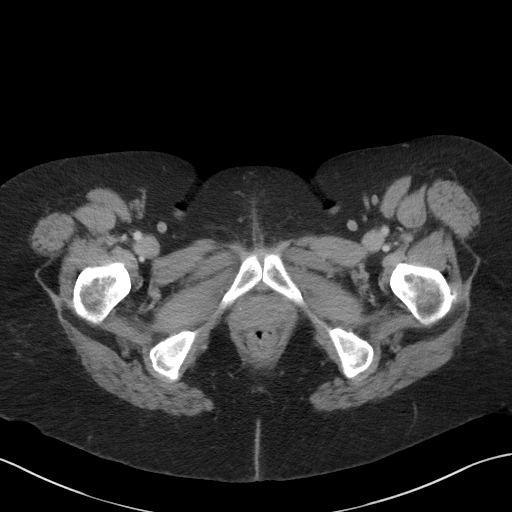
[im 19/86  soft-tissue]
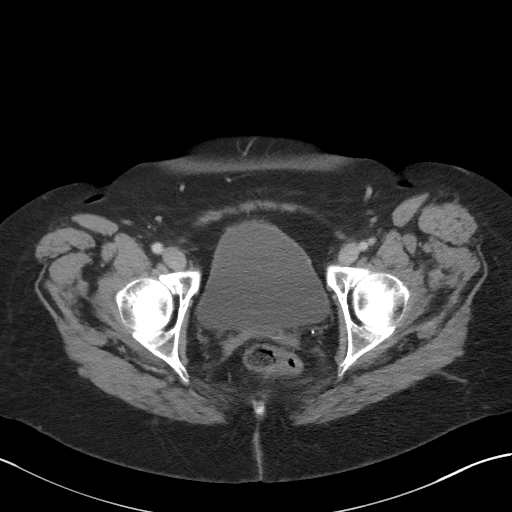
[im 24/86  soft-tissue]
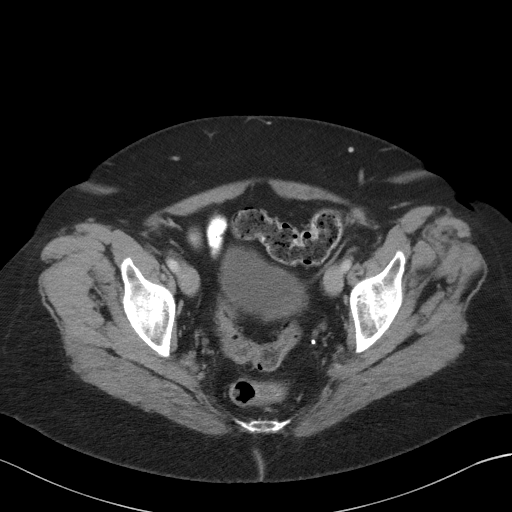
[im 29/86  soft-tissue]
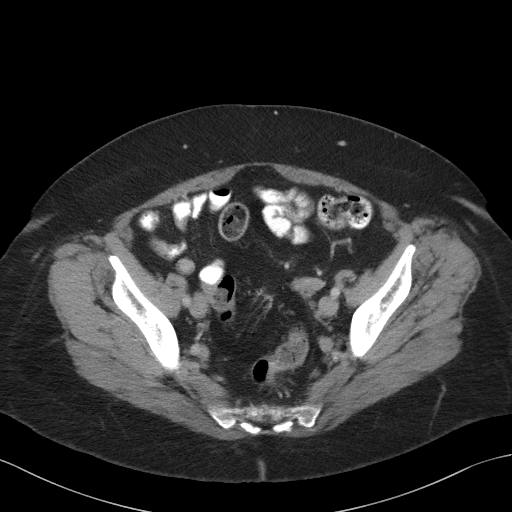
[im 38/86  soft-tissue]
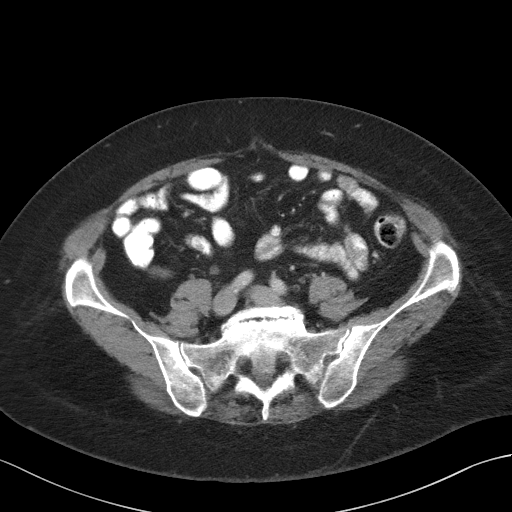
[im 43/86  soft-tissue]
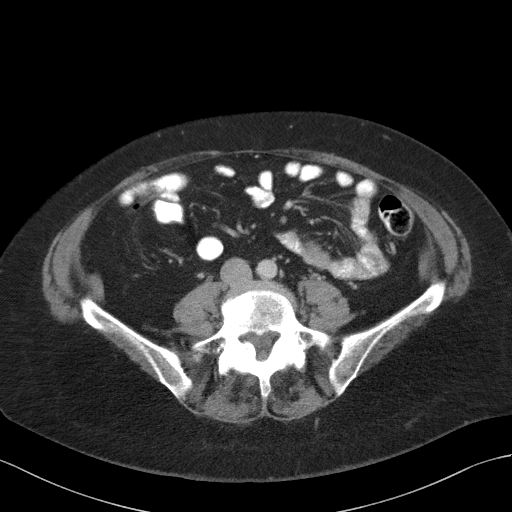
[im 48/86  soft-tissue]
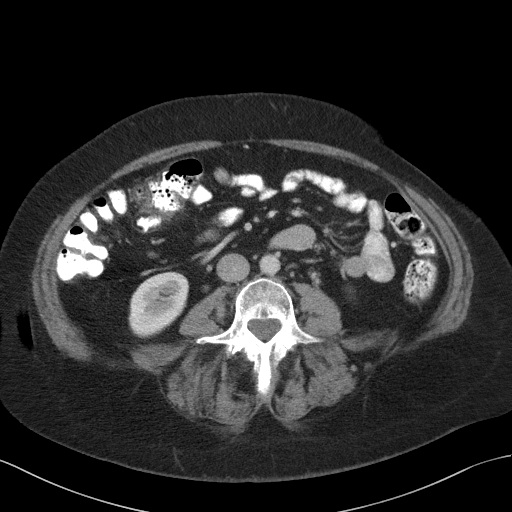
[im 57/86  soft-tissue]
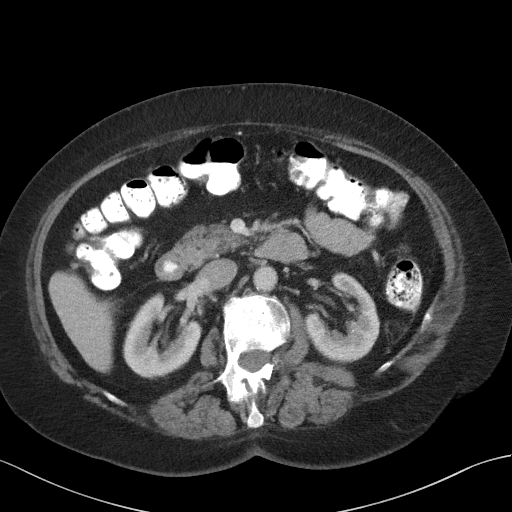
[im 57/86  bone]
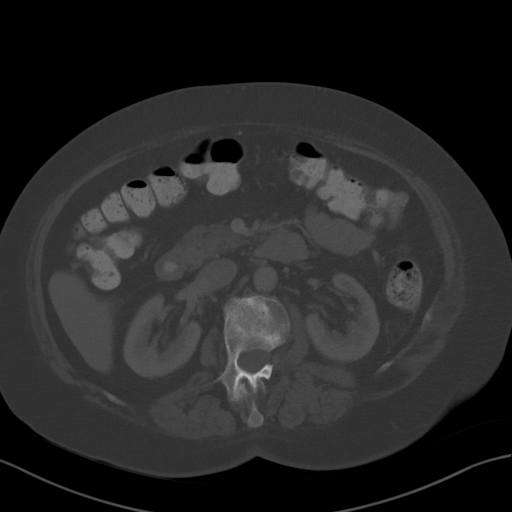
[im 62/86  soft-tissue]
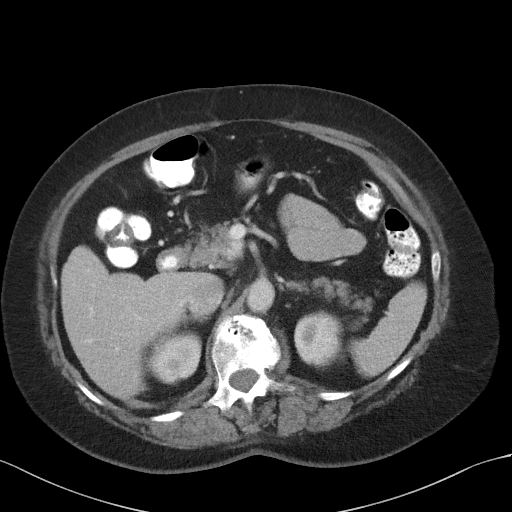
[im 67/86  soft-tissue]
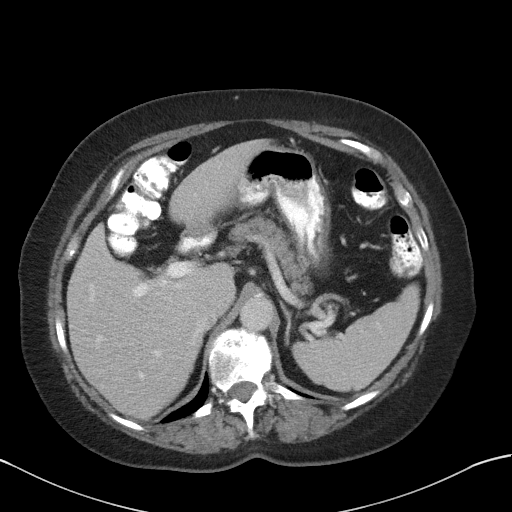
[im 76/86  soft-tissue]
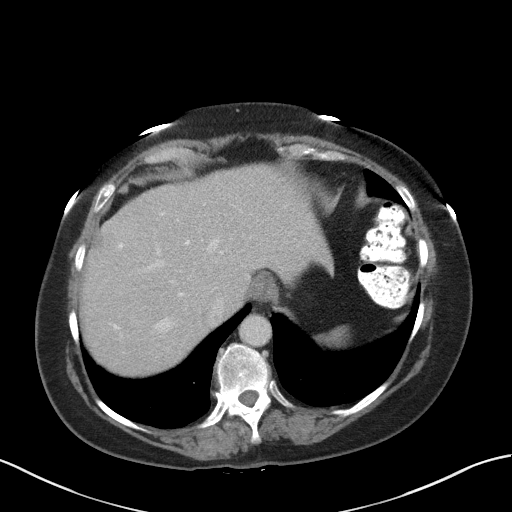
[im 81/86  soft-tissue]
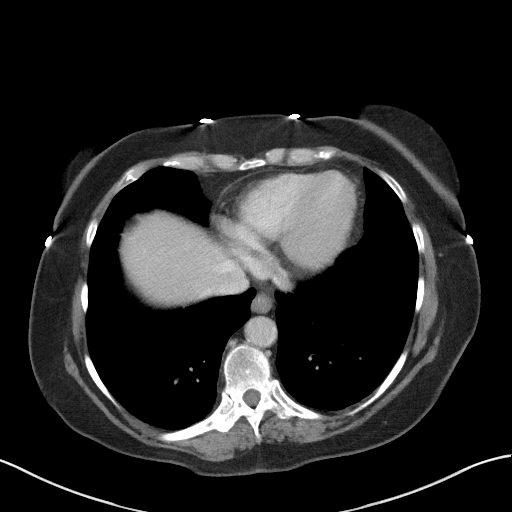

[Series 4: coronal st · coronal · 0.75mm/px · 3 of 98 slices shown]
[im 33/98  soft-tissue]
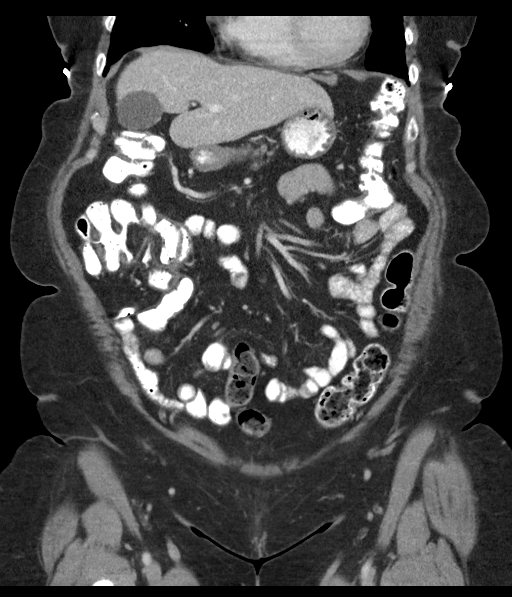
[im 44/98  soft-tissue]
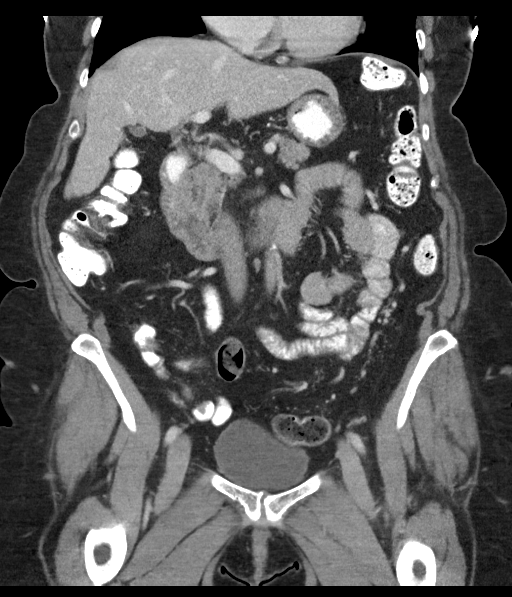
[im 54/98  soft-tissue]
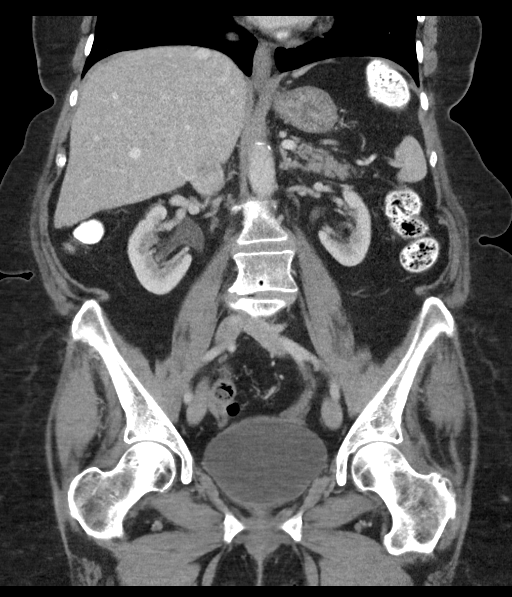

[16 of 46 positions shown; findings below may reference images not displayed]

FINDINGS: Lower chest: No acute abnormality.

Hepatobiliary: No focal liver abnormality is seen. No gallstones,
gallbladder wall thickening, or biliary dilatation.

Pancreas: Unremarkable. No pancreatic ductal dilatation or
surrounding inflammatory changes.

Spleen: Normal in size without focal abnormality.

Adrenals/Urinary Tract: Adrenal glands appear normal. Right renal
cyst is noted. No hydronephrosis or renal obstruction is noted. No
renal or ureteral calculi are noted. Urinary bladder is
unremarkable.

Stomach/Bowel: Stomach is within normal limits. No evidence of
appendiceal inflammation is noted, although appendicoliths are
noted. No evidence of bowel wall thickening, distention, or
inflammatory changes.

Vascular/Lymphatic: Aortic atherosclerosis. No enlarged abdominal or
pelvic lymph nodes.

Reproductive: Status post hysterectomy. No adnexal masses.

Other: No abdominal wall hernia or abnormality. No abdominopelvic
ascites.

Musculoskeletal: Severe multilevel degenerative disc disease is
noted in the lumbar spine. No acute osseous abnormality is noted.
IMPRESSION: No evidence of pancreatitis.

No acute abnormality seen in the abdomen or pelvis.

Severe multilevel degenerative disc disease of the lumbar spine.

Aortic Atherosclerosis (ML55K-M46.6).
